# Patient Record
Sex: Male | Born: 1970 | Race: White | Hispanic: No | Marital: Single | Smoking: Former smoker
Health system: Southern US, Community
[De-identification: ages and names within clinical notes are randomized; demographics above are authoritative.]

## PROBLEM LIST (undated history)

## (undated) DIAGNOSIS — J302 Other seasonal allergic rhinitis: Secondary | ICD-10-CM

## (undated) DIAGNOSIS — I10 Essential (primary) hypertension: Secondary | ICD-10-CM

## (undated) DIAGNOSIS — K219 Gastro-esophageal reflux disease without esophagitis: Secondary | ICD-10-CM

## (undated) HISTORY — PX: ESOPHAGOGASTRODUODENOSCOPY: SHX1529

## (undated) HISTORY — PX: TUMOR REMOVAL: SHX12

## (undated) HISTORY — PX: WISDOM TOOTH EXTRACTION: SHX21

---

## 1998-06-28 ENCOUNTER — Ambulatory Visit (HOSPITAL_COMMUNITY): Admission: RE | Admit: 1998-06-28 | Discharge: 1998-06-28 | Payer: Self-pay | Admitting: Urology

## 2006-01-09 ENCOUNTER — Ambulatory Visit: Payer: Self-pay | Admitting: Internal Medicine

## 2006-01-09 ENCOUNTER — Emergency Department (HOSPITAL_COMMUNITY): Admission: EM | Admit: 2006-01-09 | Discharge: 2006-01-09 | Payer: Self-pay | Admitting: Emergency Medicine

## 2006-01-19 ENCOUNTER — Ambulatory Visit (HOSPITAL_COMMUNITY): Admission: RE | Admit: 2006-01-19 | Discharge: 2006-01-19 | Payer: Self-pay | Admitting: Internal Medicine

## 2006-01-19 ENCOUNTER — Encounter: Payer: Self-pay | Admitting: Internal Medicine

## 2006-03-11 ENCOUNTER — Ambulatory Visit: Payer: Self-pay | Admitting: Internal Medicine

## 2006-05-24 ENCOUNTER — Ambulatory Visit: Payer: Self-pay | Admitting: Internal Medicine

## 2006-12-03 ENCOUNTER — Ambulatory Visit: Payer: Self-pay | Admitting: Internal Medicine

## 2007-02-08 ENCOUNTER — Ambulatory Visit (HOSPITAL_COMMUNITY): Admission: RE | Admit: 2007-02-08 | Discharge: 2007-02-08 | Payer: Self-pay | Admitting: Family Medicine

## 2007-03-25 IMAGING — CR DG NECK SOFT TISSUE
2 series · 2 of 2 positions shown · non-contrast
Comparison: none

CLINICAL DATA: 34 year-old male pill lodged in throat, cannot swallow.
 SOFT TISSUE NECK- 2 VIEW:

[view not recorded (1 of 2)]
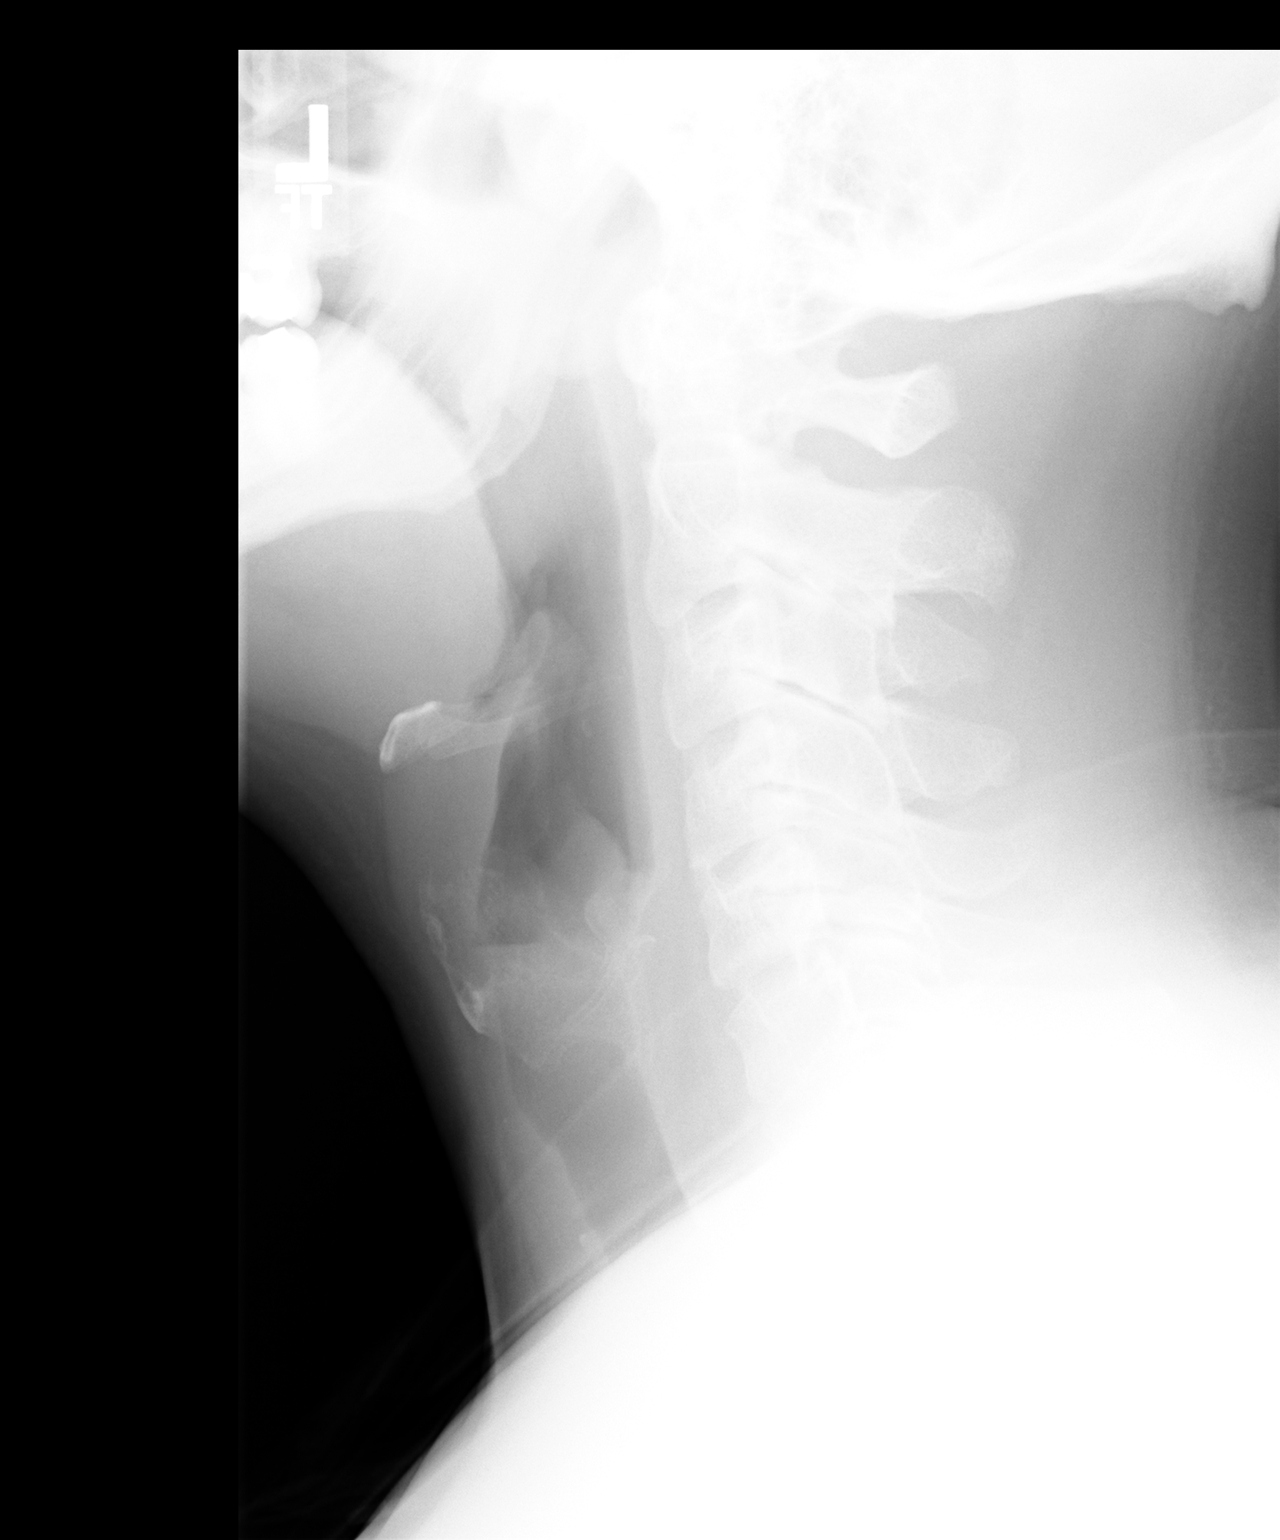

[view not recorded (2 of 2)]
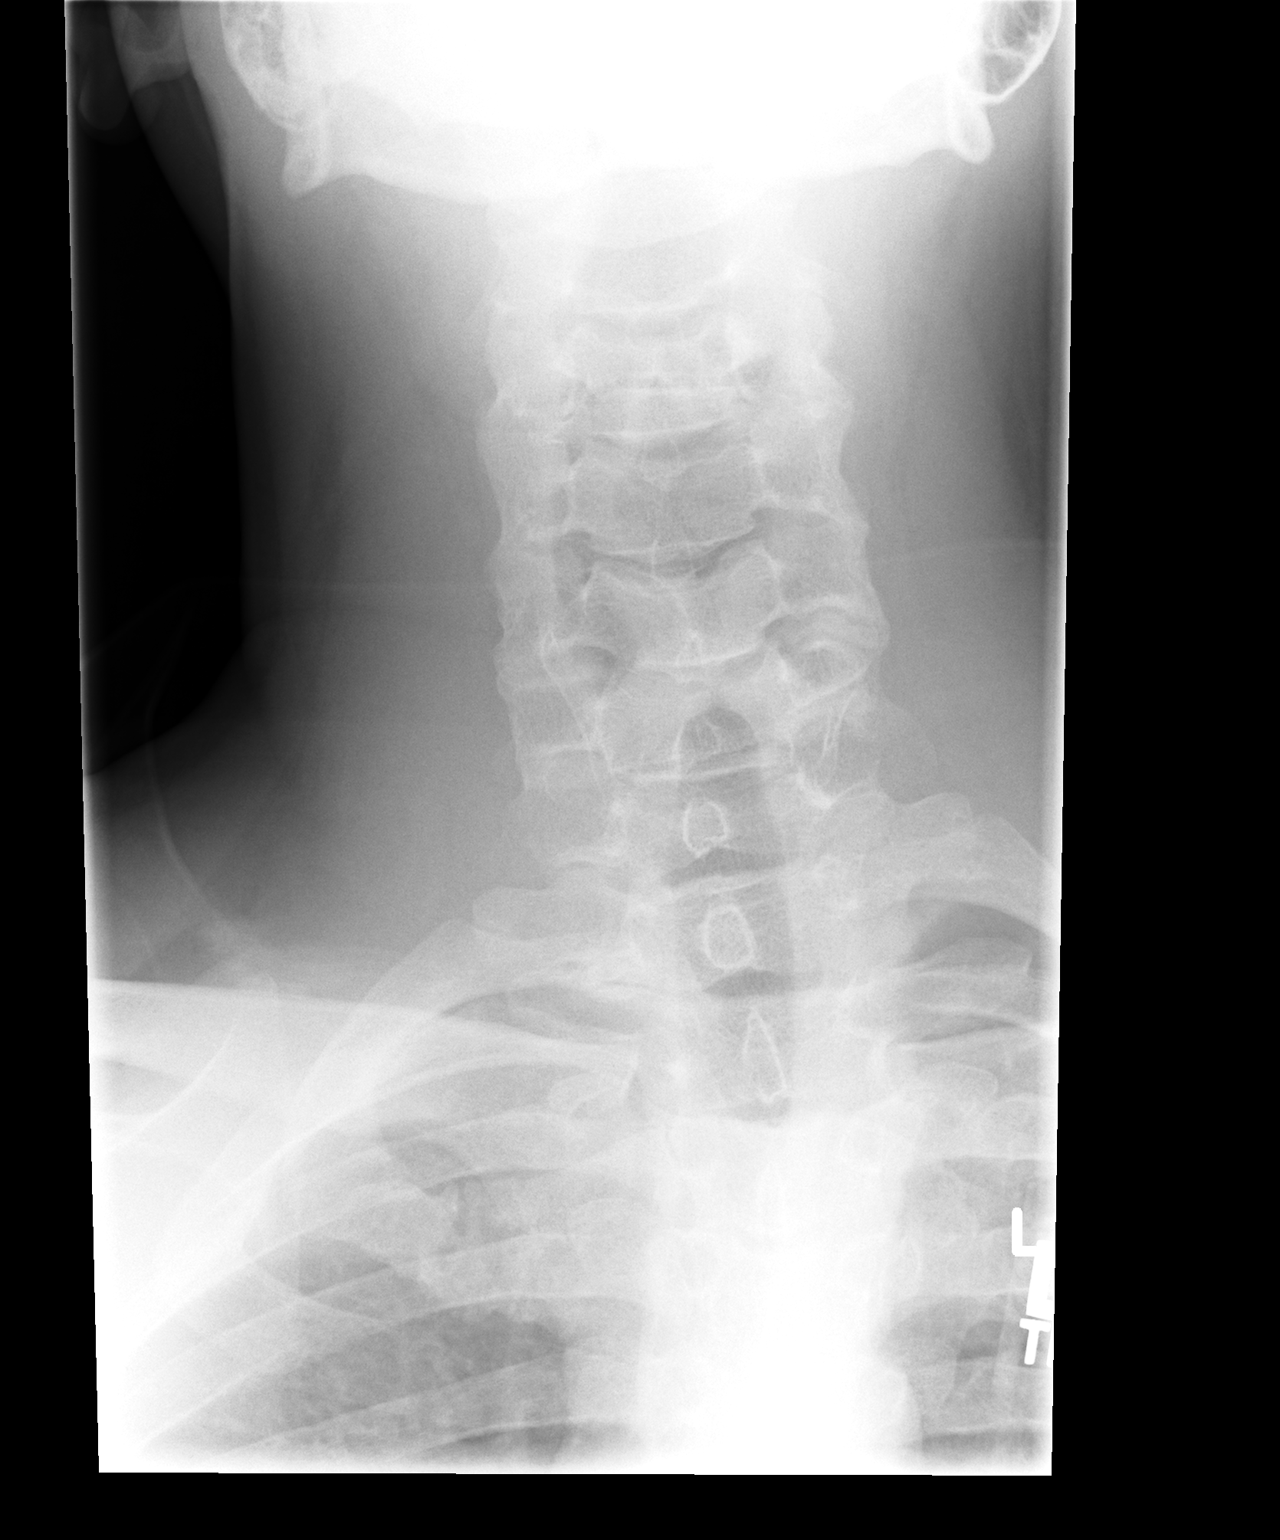

[2 of 2 positions shown; findings below may reference images not displayed]

FINDINGS: No radiopaque foreign body is identified within the upper esophagus or airway.  The epiglottis is within normal limits.  Soft tissues are unremarkable.
IMPRESSION: Negative soft tissue neck.

## 2007-11-22 ENCOUNTER — Ambulatory Visit: Payer: Self-pay | Admitting: Internal Medicine

## 2008-06-29 ENCOUNTER — Ambulatory Visit: Payer: Self-pay | Admitting: Internal Medicine

## 2008-06-29 DIAGNOSIS — S60459A Superficial foreign body of unspecified finger, initial encounter: Secondary | ICD-10-CM | POA: Insufficient documentation

## 2008-06-29 DIAGNOSIS — K200 Eosinophilic esophagitis: Secondary | ICD-10-CM | POA: Insufficient documentation

## 2008-12-26 ENCOUNTER — Ambulatory Visit: Payer: Self-pay | Admitting: Internal Medicine

## 2011-01-07 ENCOUNTER — Encounter (INDEPENDENT_AMBULATORY_CARE_PROVIDER_SITE_OTHER): Payer: Self-pay | Admitting: *Deleted

## 2011-01-20 NOTE — Assessment & Plan Note (Signed)
Summary: splinter in finger   Vital Signs:  Patient Profile:   40 Years Old Male Temp:     98.2 degrees F Pulse rate:   76 / minute BP sitting:   140 / 80  (left arm)  Vitals Entered By: Willy Eddy, LPN (June 29, 2008 7:48 AM)                 Chief Complaint:  splinter in finger since november.  History of Present Illness: presents for foreign object removal  ( injury occured over 6 weeks ago)    Prior Medication List:  No prior medications documented  Current Allergies (reviewed today): No known allergies   Past Medical History:    Reviewed history and no changes required:       eosinic esophagitis  Past Surgical History:    Reviewed history and no changes required:       EGD     Review of Systems  The patient denies anorexia, fever, weight loss, weight gain, vision loss, decreased hearing, hoarseness, chest pain, syncope, dyspnea on exertion, peripheral edema, prolonged cough, headaches, hemoptysis, abdominal pain, melena, hematochezia, severe indigestion/heartburn, hematuria, incontinence, genital sores, muscle weakness, suspicious skin lesions, transient blindness, difficulty walking, depression, unusual weight change, abnormal bleeding, enlarged lymph nodes, angioedema, breast masses, and testicular masses.     Physical Exam  General:     Well-developed,well-nourished,in no acute distress; alert,appropriate and cooperative throughout examination Head:     normocephalic and male-pattern balding.   Eyes:     No corneal or conjunctival inflammation noted. EOMI. Perrla. Funduscopic exam benign, without hemorrhages, exudates or papilledema. Vision grossly normal. Msk:     right index finger with calus formaton at base with escar at entry from removal efforts    Impression & Recommendations:  Problem # 1:  see EOSINOPHILIC ESOPHAGITIS (ICD-530.13) GI consult reviewed  Problem # 2:  FINGER SUP FB W/O MAJ OPEN WOUND&W/O MENTION INF  (ICD-915.6)  Orders: No Charge Patient Arrived (NCPA0) (NCPA0) Incision and Removal of Foreign Body, subq tissues; simple (10120)   Complete Medication List: 1)  Keflex 500 Mg Caps (Cephalexin) .... Three times a day   Patient Instructions: 1)  keflex called in   Prescriptions: KEFLEX 500 MG  CAPS (CEPHALEXIN) three times a day  #15 x 0   Entered by:   Willy Eddy, LPN   Authorized by:   Stacie Glaze MD   Signed by:   Willy Eddy, LPN on 36/64/4034   Method used:   Electronically sent to ...       [facility]  [address] [address] [facility]       [address]       [place], [place]  [address]       Ph: [phone]       Fax: [phone]   RxID:   [id number]  ]

## 2011-01-22 NOTE — Letter (Signed)
Summary: Recall Office Visit  [facility]  [address]   [place], [place] [address]   Phone: [phone]  Fax: [phone]      January 07, 2011   Brandon Monroe [address] [place], [place]  [address] [birth year]   Dear Mr. SCHRECENGOST,   According to our records, it is time for you to schedule a follow-up office visit with [place].   At your convenience, please call [phone] to schedule an office visit. If you have any questions, concerns, or feel that this letter is in error, we would appreciate your call.   Sincerely,    Diana Eves  [facility] Ph: [phone]   Fax: [phone]

## 2011-02-09 ENCOUNTER — Ambulatory Visit: Payer: Self-pay | Admitting: Internal Medicine

## 2011-02-13 ENCOUNTER — Ambulatory Visit: Payer: Self-pay | Admitting: Internal Medicine

## 2011-05-05 NOTE — Assessment & Plan Note (Signed)
NAMEMarland Kitchen  Brandon Monroe, Brandon Monroe              CHART#:  [id number]   DATE:  12/26/2008                       DOB:  [birth year]   Follow up eosinophilic esophagitis/gastroesophageal reflux disease.  Last seen 1 year ago on November 22, 2007.  The patient has done very  well.  He saw lost 11 pounds since he was last seen.  Typical reflux  symptoms quiescent on Prevacid 30 mg orally daily.  He is having no  dysphagia.  He saw Dr. Lucie Leather, he was found to have multiple allergies  including dogs, cats, peanuts, couple of other foods which he is now  trying to avoid.  He is doing extremely well.  He is followed by Dr.  Regino Schultze primarily.  His blood pressures are up.  He is being managed for  that problem.  He quit smoking a while back and was getting along well  without cigarettes, and alcohol assumption is minimal i.e. 10 beers a  year.   PHYSICAL EXAMINATION:  GENERAL:  A pleasant 40 year old gentleman  resting comfortably.  VITAL SIGNS:  Weight 260, height 6 feet, temperature 98.9, BP 138/96,  and pulse 80.  SKIN:  Warm and dry.  CHEST:  Lungs are clear to auscultation.  CARDIAC:  Regular rate and rhythm without murmur, gallop, or rub.  ABDOMEN:  Obese, positive bowel sounds, soft, nontender without  appreciable mass or organomegaly.  EXTREMITIES:  No edema.   ASSESSMENT:  Eosinophilic esophagitis, quiescent.  Gastroesophageal  reflux disease symptoms are well controlled on Prevacid.   RECOMMENDATIONS:  Continue weight loss would be a very good idea.  This  would help him to manage with his blood pressures well.  I suggested a  goal of losing additional 30 pounds over the next 12 months.  Unless  something comes up, I will touch base with the patient in 2 years.       Brandon Monroe, M.D.  Electronically Signed     RMR/MEDQ  D:  12/26/2008  T:  12/26/2008  Job:  [id number]   cc:   Kirk Ruths, M.D.

## 2011-05-05 NOTE — Assessment & Plan Note (Signed)
NAME:  Brandon Monroe, Brandon Monroe              CHART#:  [id number]   DATE:  11/22/2007                       DOB:  12-16-1971   CHIEF COMPLAINT:  Follow up of acid reflux and eosinophilic esophagitis.   SUBJECTIVE:  Patient is here for a follow-up visit.  He was last seen on  12/03/2006.  He has a history of eosinophilic esophagitis and chronic  GERD.  He has been doing very well.  He is on Prevacid 30 mg SoluTab  daily.  He has rare breakthrough reflux symptoms usually triggered by  certain foods.  He has had no recurrence of his dysphagia or  odynophagia.  Denies any abdominal pain, nausea or vomiting.  He quit  smoking.  He has gained approximately 40 pounds since then but only 4  pounds in the last 1 year.  Denies any problems with his bowel  movements.   CURRENT MEDICATIONS:  See updated list.   ALLERGIES:  No known drug allergies.   PHYSICAL EXAMINATION:  VITAL SIGNS:  Weight 276, height 6 feet 1/2 inch,  temp 99.1, blood pressure 162/96, pulse 80.  GENERAL:  Pleasant, well-nourished, well-developed Caucasian male in no  acute distress.  SKIN:  Warm and dry, no jaundice.  HEENT:  Sclerae are anicteric.  ABDOMEN:  Positive bowel sounds, soft, nontender, nondistended, no  organomegaly or masses.  LOWER EXTREMITIES:  No edema.   IMPRESSION:  1. Eosinophilic esophagitis, asymptomatic at this time.  2. Chronic gastroesophageal reflux disease, well controlled on      Prevacid.   RECOMMENDATIONS:  Patient requests switching to omeprazole due to  decreased cost.  We will go ahead and give him a trial of omeprazole 40  mg daily #30, 11 refills.  As long as his reflux  is well controlled, I see no problem with this.  He will give [place] a call  if he has any further problems.  Otherwise, we will see him back in 1  year.       Tana Coast, P.A.  Electronically Signed     R. Roetta Sessions, M.D.  Electronically Signed    LL/MEDQ  D:  11/22/2007  T:  11/22/2007  Job:  [id number]

## 2011-05-08 NOTE — Op Note (Signed)
NAME:  Brandon Monroe, Brandon Monroe NO.:  [id number]   MEDICAL RECORD NO.:  [id number]          PATIENT TYPE:  EMS   LOCATION:  ED                            FACILITY:  [facility]   PHYSICIAN:  R. Roetta Sessions, M.D. DATE OF BIRTH:  Jul 01, 1971   DATE OF PROCEDURE:  01/09/2006  DATE OF DISCHARGE:  01/09/2006                                 OPERATIVE REPORT   PROCEDURE:  Emergency esophagogastroduodenoscopy with esophageal pill  disimpaction.   ENDOSCOPIST:  Jonathon Bellows, M.D.   INDICATIONS FOR PROCEDURE:  The patient is a 40 year old gentleman with  chronic intermittent esophageal dysphagia and gastroesophageal reflux  disease who took an over-the-counter cold remedy followed by a vitamin at  approximately 5 p.m. today.  As soon as he swallowed the second pill, he  felt it hang pointing to his sternum.  Ever since, he has not be able to  swallow anything including saliva.  He presented to the emergency  department.  He was seen by Rhae Lerner. Margretta Ditty, M.D.  He was given some IV  Glucagon and observed and nothing pretty much changed over a couple of  hours.  The emergency department staff subsequently called me.  An EGD is  now being done because there is a high likelihood of pill impaction and/or  obstruction.  Potential risks, benefits, and alternatives have been reviewed  with Mr. Klabunde and his parents.  I told him the goal this evening is to  disimpact.  If he had a structural lesion such as a ring, stricture, or web,  that I would more than likely not dilate him in this setting and would bring  him back electively when his upper GI tract is empty.  All questions were  answered, and all parties were agreeable.  Please see the documentation in  the medical record.   PROCEDURE NOTE:  Oxygen saturation, blood pressure, and pulse were monitored  throughout the entire procedure.  Conscious sedation:  Versed 3 mg IV,  Demerol 75 mg IV.  Cetacaine spray for topical oropharyngeal  anesthesia.  Instrument:  Olympus video chip system.   FINDINGS:  Esophagus was easily intubated. There was a brown, granular,  foreign body in the tubular esophagus, mid-level.  There was a fluid level  above this.  The fluid was easily suctioned out.  The patient did some  heaving as I inspected this area.  Please see photos.  As he did heave, this  material started to break up where I could see the unobstructed lumen  distally.  I nudged the scope up against the remainder of this material, and  it then readily fell into the stomach.  The patient had quite a bit of bile-  stained gastric fluid.  I did not carry out a complete examination of the  stomach and duodenum.  It appears that he did have a hiatal hernia and  distal esophageal erosions and a ring as well.  I easily traversed the EG  junction with scope.  The esophageal mucosa was somewhat macerated, but I  did not see an ulcer.  He did appear to  have some distal esophageal  erosions.  Once the impaction was relieved, the exam was terminated. The  patient tolerated the procedure very well.   IMPRESSION:  1.  Status post pill impaction as described above.  2.  Status post disimpaction as described above.  3.  Distal esophageal erosions.  4.  Schatzki ring.  5.  Hiatal hernia.  6.  Stomach incompletely seen.  Exam not carried out to completion for the      reasons outlined above.   RECOMMENDATIONS:  1.  Soft diet until he can be brought back for an elective      esophagogastroduodenoscopy with most likely esophageal dilation.  2.  We will start him on some Prevacid Solutabs, 1 Solutab on the tongue      daily.   He is really not having any chest pain whatsoever, and I suspect he will do  well without requiring narcotics or further symptomatic treatment.   Our office will call him the first of the week and arrange endoscopy some  time the week after next.      Jonathon Bellows, M.D.  Electronically Signed      RMR/MEDQ  D:  01/09/2006  T:  01/10/2006  Job:  [id number]   cc:   Rhae Lerner. Margretta Ditty, M.D.  [address]  [place]  [place] [address]   [facility]

## 2011-05-08 NOTE — Op Note (Signed)
NAME:  SHAHZAIB, AZEVEDO             ACCOUNT NO.:  [id number]   MEDICAL RECORD NO.:  [id number]          PATIENT TYPE:  AMB   LOCATION:  DAY                           FACILITY:  [facility]   PHYSICIAN:  R. Roetta Sessions, M.D. DATE OF BIRTH:  October 01, 1971   DATE OF PROCEDURE:  01/19/2006  DATE OF DISCHARGE:                                 OPERATIVE REPORT   PROCEDURE:  Esophagogastroduodenoscopy with esophageal biopsy, esophageal  dilation with the scope.   INDICATIONS FOR PROCEDURE:  The patient is a 40 year old gentleman with  recurrent esophageal dysphagia. He presented back on January 09, 2006 with a  pill impaction. He underwent an emergent EGD and disimpaction. He was noted  to have a distal esophageal ring. He has been on a soft diet and Prevacid  every since. He returns now for elective EGD with esophageal dilation as  appropriate. This approach has been discussed with the patient at length.  Potential risks, benefits, and alternatives have been reviewed and questions  answered. He is agreeable. Please see documentation in the medical record.   PROCEDURE NOTE:  O2 saturation, blood pressure, pulse, and respirations were  monitored throughout the entire procedure. Conscious sedation with Versed 5  mg IV and Demerol 100 mg IV in divided doses.  Cetacaine spray for topical  oropharyngeal anesthesia.   FINDINGS:  Examination of the tubular esophagus revealed a prominent ringed  or feline esophagus.   The GIF-140 scope actually hung up in the proximal esophagus. Only after  mild to moderate pressure was applied was the scope was released by the  rings (this was associated with a traumatic dilation of the proximal  esophagus). I was then able to freely advance the scope down into the  stomach.   Stomach:  Gastric cavity was empty and insufflated well with air. Thorough  examination of gastric mucosa including retroflexed view of the proximal  stomach and esophagogastric junction was  undertaken. The stomach was empty.  There was a small hiatal hernia. Pylorus patent and easily traversed.  Examination of bulb and second portion revealed no abnormalities. We pulled  the scope back into the esophagus. There was a 4-cm rent in the proximal  esophageal mucosa consistent with trauma of esophageal dilation with the  diagnostic scope. There was prominent ring appearance of the esophagus. It  had the appearance of a trachea or cat esophagus. This goes with  eosinophilic esophagitis. I did not feel it would be safe to attempt  dilation in any further manner. Subsequently, the mid esophagus was biopsied  multiple times to further evaluate this gentleman for eosinophilic  esophagitis. The patient tolerated the procedure well and was reactive to  endoscopy.   IMPRESSION:  1.  Traumatic dilation of the proximal esophagus with the scope. Prominent      feline or ringed appearing esophagus in this clinical scenario is      eosinophilic esophagitis until proven otherwise. Biopsies of the      esophageal mucosa taken. The remainder of the esophageal mucosa appeared      normal.  2.  Small hiatal hernia. Otherwise normal stomach.  Patent pylorus. Normal D1      and D2.   RECOMMENDATIONS:  1.  Continue Prevacid 30 mg orally daily via SoluTab.  2.  Soft diet.  3.  Follow up on pathology. If pathology is confirmatory, the patient will      need to go on inhalant steroids orally.  4.  Further recommendations to follow.      Jonathon Bellows, M.D.  Electronically Signed     RMR/MEDQ  D:  01/19/2006  T:  01/19/2006  Job:  [id number]   cc:   Rhae Lerner. Margretta Ditty, M.D.  [address]  [place]  [place] [address]   [facility]

## 2011-06-15 ENCOUNTER — Ambulatory Visit: Payer: Self-pay | Admitting: Internal Medicine

## 2011-07-06 ENCOUNTER — Ambulatory Visit: Payer: Self-pay | Admitting: Internal Medicine

## 2011-07-31 ENCOUNTER — Ambulatory Visit: Payer: Self-pay | Admitting: Internal Medicine

## 2011-09-25 ENCOUNTER — Encounter: Payer: Self-pay | Admitting: Internal Medicine

## 2011-09-28 ENCOUNTER — Ambulatory Visit: Payer: Self-pay | Admitting: Internal Medicine

## 2011-10-16 ENCOUNTER — Ambulatory Visit: Payer: Self-pay | Admitting: Internal Medicine

## 2011-11-09 ENCOUNTER — Ambulatory Visit: Payer: Self-pay | Admitting: Internal Medicine

## 2011-12-28 ENCOUNTER — Ambulatory Visit: Payer: Self-pay | Admitting: Internal Medicine

## 2012-01-08 ENCOUNTER — Encounter: Payer: Self-pay | Admitting: Internal Medicine

## 2012-02-01 ENCOUNTER — Ambulatory Visit: Payer: Self-pay | Admitting: Internal Medicine

## 2012-02-19 ENCOUNTER — Ambulatory Visit: Payer: Self-pay | Admitting: Internal Medicine

## 2012-02-23 ENCOUNTER — Ambulatory Visit: Payer: Self-pay | Admitting: Internal Medicine

## 2012-05-04 ENCOUNTER — Ambulatory Visit: Payer: Self-pay | Admitting: Internal Medicine

## 2012-08-08 ENCOUNTER — Encounter: Payer: Self-pay | Admitting: Internal Medicine

## 2013-02-06 ENCOUNTER — Ambulatory Visit: Payer: Self-pay | Admitting: Internal Medicine

## 2016-04-26 ENCOUNTER — Encounter (HOSPITAL_COMMUNITY): Payer: Self-pay | Admitting: Emergency Medicine

## 2016-04-26 ENCOUNTER — Emergency Department (HOSPITAL_COMMUNITY)
Admission: EM | Admit: 2016-04-26 | Discharge: 2016-04-26 | Disposition: A | Discharge status: Home / Self Care | Payer: BLUE CROSS/BLUE SHIELD | Attending: Emergency Medicine | Admitting: Emergency Medicine

## 2016-04-26 DIAGNOSIS — I10 Essential (primary) hypertension: Secondary | ICD-10-CM | POA: Insufficient documentation

## 2016-04-26 DIAGNOSIS — Z7982 Long term (current) use of aspirin: Secondary | ICD-10-CM | POA: Diagnosis not present

## 2016-04-26 DIAGNOSIS — K122 Cellulitis and abscess of mouth: Secondary | ICD-10-CM | POA: Insufficient documentation

## 2016-04-26 DIAGNOSIS — Z79899 Other long term (current) drug therapy: Secondary | ICD-10-CM | POA: Diagnosis not present

## 2016-04-26 DIAGNOSIS — R0602 Shortness of breath: Secondary | ICD-10-CM | POA: Diagnosis present

## 2016-04-26 DIAGNOSIS — Z87891 Personal history of nicotine dependence: Secondary | ICD-10-CM | POA: Diagnosis not present

## 2016-04-26 HISTORY — DX: Essential (primary) hypertension: I10

## 2016-04-26 LAB — RAPID STREP SCREEN (MED CTR MEBANE ONLY): Streptococcus, Group A Screen (Direct): NEGATIVE

## 2016-04-26 MED ORDER — DEXAMETHASONE SODIUM PHOSPHATE 4 MG/ML IJ SOLN
10.0000 mg | Freq: Once | INTRAMUSCULAR | Status: AC
  Administered 2016-04-26: 10 mg via INTRAMUSCULAR
  Filled 2016-04-26: qty 3

## 2016-04-26 NOTE — Discharge Instructions (Signed)
Uvulitis °Uvulitis is infection or inflammation of the uvula. The uvula is the small, finger-like piece of tissue that hangs down at the back of your throat. °CAUSES °This condition may be caused by: °· An infection in the mouth or throat. This is the most common cause. °· Trauma to the uvula. Causes of trauma include burning your mouth and heavy snoring. °· Fluid build-up (edema). Edema can be triggered be an allergic reaction. Uvulitis that is caused by edema is called Quincke disease. °· Inhaling irritants, such as chemical agents, smoke, or steam. °SYMPTOMS °Symptoms of this condition depend on the cause.  °Symptoms of uvulitis that is caused by infection include: °· Red, swollen uvula. °· Sore throat. °· Fever. °· Headache. °· Swollen neck glands. °Symptoms of uvulitis that is caused by trauma, edema, or irritation include: °· Red, swollen uvula. °· Sore throat. °· Trouble swallowing. °· Choking or gagging. °· Trouble breathing. °DIAGNOSIS °This condition is diagnosed with a physical exam. You also may have tests, such as a throat culture and blood tests. °TREATMENT °Treatment for this condition depends on the cause. Treatment may involve: °· Antibiotic medicine. Antibiotics may be prescribed if a bacterial infection is the cause. °· Steroid medicine. Steroids may be given if edema is the cause. °· Surgery to remove part of the uvula (partial uvulectomy). °HOME CARE INSTRUCTIONS °· Rest as much as possible until your condition improves. °· Drink enough fluid to keep your urine clear or pale yellow. °· Take over-the-counter and prescription medicines only as told by your health care provider. °· If you were prescribed an antibiotic medicine, take it as told by your health care provider. Do not stop taking the antibiotic even if you start to feel better. °· Use a cool-mist humidifier to ease irritation in your throat. °· While your throat is sore: °¨ Eat soft foods or drink liquids, such as soup. °¨ Gargle with a  salt-water mixture 3-4 times per day or as needed. To make a salt-water mixture, completely dissolve ½-1 tsp of salt in 1 cup of warm water. °· Keep all follow-up visits as told by your health care provider. This is important. °SEEK MEDICAL CARE IF: °· You have a fever. °· You have trouble eating. °· Your symptoms do not get better. °· Your symptoms come back after treatment. °SEEK IMMEDIATE MEDICAL CARE IF: °· You have trouble breathing. °· You have trouble swallowing. °  °This information is not intended to replace advice given to you by your health care provider. Make sure you discuss any questions you have with your health care provider. °  °Document Released: 07/17/2004 Document Revised: 08/28/2015 Document Reviewed: 02/27/2015 °Elsevier Interactive Patient Education ©2016 Elsevier Inc. ° °

## 2016-04-26 NOTE — ED Notes (Addendum)
Pt reports waking up feeling like his throat was closing. Pt states he has seasonal allergies. Pt reports sore throat. Airway patent. Denies new medications or food. Pt's voice sounds raspy upon assessment. Pt reports taking two benadryl PTA.

## 2016-04-26 NOTE — ED Notes (Signed)
Pt resting comfortably

## 2016-04-26 NOTE — ED Provider Notes (Signed)
CSN: [id number]     Arrival date & time 04/26/16  0865 History  By signing my name below, I, Soijett Blue, attest that this documentation has been prepared under the direction and in the presence of Nelva Nay, MD. Electronically Signed: Soijett Blue, ED Scribe. 04/26/2016. 9:09 AM.   Chief Complaint  Patient presents with  . Shortness of Breath      The history is provided by the patient. No language interpreter was used.    Brandon Monroe is a 45 y.o. male with a medical hx of HTN who presents to the Emergency Department complaining of SOB onset this morning. Pt reports that he woke up this morning with throat closing sensation. Pt states that he experienced these symptoms mildly last night which he contributed to allergies. Pt takes claritin and Rx nasal spray daily and 2 benadryl nightly for his seasonal allergies. Pt states that he ate [employer] last night. Pt is having associated symptoms of post-nasal drip. He notes that he has tried 2 benadryl with no relief of his symptoms. He denies any other symptoms. Pt notes that he does take medications for his HTN.   Past Medical History  Diagnosis Date  . Esophagitis     eosinic   . Hypertension    Past Surgical History  Procedure Laterality Date  . Esophagogastroduodenoscopy    . Tumor removal      RT testicle   No family history on file. Social History  Substance Use Topics  . Smoking status: Former Games developer  . Smokeless tobacco: None  . Alcohol Use: Yes     Comment: occ    Review of Systems  Constitutional: Negative for fever and chills.  HENT: Positive for postnasal drip.   Respiratory: Positive for shortness of breath.   All other systems reviewed and are negative.   Allergies  Peanut-containing drug products  Home Medications   Prior to Admission medications   Medication Sig Start Date End Date Taking? Authorizing Provider  aspirin EC 81 MG tablet Take 81 mg by mouth daily.   Yes Historical Provider, MD   diphenhydrAMINE (BENADRYL) 25 MG tablet Take 50 mg by mouth at bedtime.   Yes Historical Provider, MD  fluticasone (FLONASE) 50 MCG/ACT nasal spray Place 2 sprays into both nostrils daily. 03/05/16  Yes Historical Provider, MD  hydrochlorothiazide (HYDRODIURIL) 25 MG tablet Take 1 tablet by mouth daily. 03/05/16  Yes Historical Provider, MD  ibuprofen (ADVIL,MOTRIN) 200 MG tablet Take 400 mg by mouth every 6 (six) hours as needed for moderate pain.   Yes Historical Provider, MD  loratadine (CLARITIN) 10 MG tablet Take 10 mg by mouth daily.   Yes Historical Provider, MD  losartan (COZAAR) 100 MG tablet Take 1 tablet by mouth daily. 03/05/16  Yes Historical Provider, MD  Multiple Vitamin (MULTIVITAMIN WITH MINERALS) TABS tablet Take 1 tablet by mouth daily.   Yes Historical Provider, MD   BP 161/89 mmHg  Pulse 106  Temp(Src) 97.7 F (36.5 C) (Oral)  Resp 22  Ht  (1.854 m)  Wt 290 lb (131.543 kg)  BMI 38.27 kg/m2  SpO2 99% Physical Exam Physical Exam  Nursing note and vitals reviewed. Constitutional: He is oriented to person, place, and time. He appears well-developed and well-nourished. No distress.  Patient shouldn't able to speak in full sentences.   HENT: Mouth: Uvula is swollen and edematous.  No airway obstruction.  Head: Normocephalic and atraumatic.  Eyes: Pupils are equal, round, and reactive to light.  Neck: Normal range of motion.  Cardiovascular: Normal rate and intact distal pulses.   Pulmonary/Chest: No respiratory distress.  no wheezing no use of accessory muscles. Abdominal: Normal appearance. He exhibits no distension.  Musculoskeletal: Normal range of motion.  Neurological: He is alert and oriented to person, place, and time. No cranial nerve deficit.  Skin: Skin is warm and dry. No rash noted.  Psychiatric: He has a normal mood and affect. His behavior is normal.   ED Course  Procedures (including critical care time) Medications  dexamethasone (DECADRON)  injection 10 mg (10 mg Intramuscular Given 04/26/16 0918)    DIAGNOSTIC STUDIES: Oxygen Saturation is 99% on RA, nl by my interpretation.    COORDINATION OF CARE: 9:07 AM Discussed treatment plan with pt at bedside which includes rapid strep screen and decadron injection and pt agreed to plan.    Labs Review Labs Reviewed  RAPID STREP SCREEN (NOT AT [facility])  CULTURE, GROUP A STREP [facility])     MDM   Final diagnoses:  Uvulitis   I personally performed the services described in this documentation, which was scribed in my presence. The recorded information has been reviewed and considered.    Nelva Nayobert Emmersen Garraway, MD 04/26/16 [phone]

## 2016-04-29 LAB — CULTURE, GROUP A STREP (THRC)

## 2016-10-20 ENCOUNTER — Encounter (HOSPITAL_COMMUNITY): Payer: Self-pay | Admitting: *Deleted

## 2016-10-20 ENCOUNTER — Emergency Department (HOSPITAL_COMMUNITY)
Admission: EM | Admit: 2016-10-20 | Discharge: 2016-10-21 | Disposition: A | Discharge status: Home / Self Care | Payer: BLUE CROSS/BLUE SHIELD | Attending: Emergency Medicine | Admitting: Emergency Medicine

## 2016-10-20 DIAGNOSIS — Z79899 Other long term (current) drug therapy: Secondary | ICD-10-CM | POA: Diagnosis not present

## 2016-10-20 DIAGNOSIS — R0789 Other chest pain: Secondary | ICD-10-CM

## 2016-10-20 DIAGNOSIS — R0781 Pleurodynia: Secondary | ICD-10-CM | POA: Diagnosis present

## 2016-10-20 DIAGNOSIS — Z791 Long term (current) use of non-steroidal anti-inflammatories (NSAID): Secondary | ICD-10-CM | POA: Insufficient documentation

## 2016-10-20 DIAGNOSIS — Z87891 Personal history of nicotine dependence: Secondary | ICD-10-CM | POA: Diagnosis not present

## 2016-10-20 DIAGNOSIS — Z7982 Long term (current) use of aspirin: Secondary | ICD-10-CM | POA: Insufficient documentation

## 2016-10-20 DIAGNOSIS — K219 Gastro-esophageal reflux disease without esophagitis: Secondary | ICD-10-CM

## 2016-10-20 DIAGNOSIS — R51 Headache: Secondary | ICD-10-CM | POA: Insufficient documentation

## 2016-10-20 DIAGNOSIS — I10 Essential (primary) hypertension: Secondary | ICD-10-CM | POA: Insufficient documentation

## 2016-10-20 HISTORY — DX: Other seasonal allergic rhinitis: J30.2

## 2016-10-20 HISTORY — DX: Gastro-esophageal reflux disease without esophagitis: K21.9

## 2016-10-20 NOTE — ED Triage Notes (Signed)
Pt c/o mid sternal chest pain that started tonight; pt states it feels like a burning sensation; pt denies any radiation of pain; pt states he has a hx of acid reflux and pt states his head is throbbing; pt states his BP has been running hight at home

## 2016-10-21 ENCOUNTER — Emergency Department (HOSPITAL_COMMUNITY): Payer: BLUE CROSS/BLUE SHIELD

## 2016-10-21 LAB — COMPREHENSIVE METABOLIC PANEL
ALT: 39 U/L (ref 17–63)
AST: 33 U/L (ref 15–41)
Albumin: 4.1 g/dL (ref 3.5–5.0)
Alkaline Phosphatase: 83 U/L (ref 38–126)
Anion gap: 7 (ref 5–15)
BUN: 13 mg/dL (ref 6–20)
CO2: 32 mmol/L (ref 22–32)
Calcium: 9.1 mg/dL (ref 8.9–10.3)
Chloride: 98 mmol/L — ABNORMAL LOW (ref 101–111)
Creatinine, Ser: 0.99 mg/dL (ref 0.61–1.24)
GFR calc Af Amer: >60 mL/min (ref >60)
GFR calc non Af Amer: >60 mL/min (ref >60)
Glucose, Bld: 131 mg/dL — ABNORMAL HIGH (ref 65–99)
Potassium: 3.7 mmol/L (ref 3.5–5.1)
Sodium: 137 mmol/L (ref 135–145)
Total Bilirubin: 0.6 mg/dL (ref 0.3–1.2)
Total Protein: 7.6 g/dL (ref 6.5–8.1)

## 2016-10-21 LAB — CBC WITH DIFFERENTIAL/PLATELET
Basophils Absolute: 0.1 10*3/uL (ref 0.0–0.1)
Basophils Relative: 1 %
Eosinophils Absolute: 0.5 10*3/uL (ref 0.0–0.7)
Eosinophils Relative: 6 %
HCT: 41.4 % (ref 39.0–52.0)
Hemoglobin: 14.5 g/dL (ref 13.0–17.0)
Lymphocytes Relative: 42 %
Lymphs Abs: 3.2 10*3/uL (ref 0.7–4.0)
MCH: 32 pg (ref 26.0–34.0)
MCHC: 35 g/dL (ref 30.0–36.0)
MCV: 91.4 fL (ref 78.0–100.0)
Monocytes Absolute: 0.9 10*3/uL (ref 0.1–1.0)
Monocytes Relative: 11 %
Neutro Abs: 3 10*3/uL (ref 1.7–7.7)
Neutrophils Relative %: 40 %
Platelets: 189 10*3/uL (ref 150–400)
RBC: 4.53 MIL/uL (ref 4.22–5.81)
RDW: 13 % (ref 11.5–15.5)
WBC: 7.5 10*3/uL (ref 4.0–10.5)

## 2016-10-21 LAB — TROPONIN I: Troponin I: <0.03 ng/mL (ref <0.03)

## 2016-10-21 MED ORDER — FAMOTIDINE 20 MG PO TABS
20.0000 mg | ORAL_TABLET | Freq: Once | ORAL | Status: AC
  Administered 2016-10-21: 20 mg via ORAL
  Filled 2016-10-21: qty 1

## 2016-10-21 MED ORDER — GI COCKTAIL ~~LOC~~
30.0000 mL | Freq: Once | ORAL | Status: AC
  Administered 2016-10-21: 30 mL via ORAL
  Filled 2016-10-21: qty 30

## 2016-10-21 NOTE — ED Provider Notes (Signed)
[facility]EM[facility]Y DEPT Provider Note   CSN: [id number] Arrival date & time: 10/20/16  2337  By signing my name below, I, Vista Mink, attest that this documentation has been prepared under the direction and in the presence of No att. providers found. Electronically signed, Vista Mink, ED Scribe. 10/21/16. 12:28 AM.  Time seen 12:04 AM  History   Chief Complaint Chief Complaint  Patient presents with  . Chest Pain    HPI HPI Comments: Brandon Monroe is a 45 y.o. male with Hx of GERD, who presents to the Emergency Department complaining of intermittent central chest pain that started on October 29. Pt states that it feels like a burning sensation in his central chest, currently a 2/10 and was a 6/10 earlier today and the first day it started. Pt states that he checked his BP tonight and was 165/86, normally runs 122/76. He states that he became anxious and started to have a headache described as "it felt like my heart was pounding in my head" lasted one hour and subsided on arrival to the ED. His chest pain is exacerbated by laying down. His chest pain this evening started around 1830 after eating dinner and ate a [place] sandwich and grapes. He is unsure whether these foods exacerbated the pain. Pt is currently being treated for HTN by his PCP which is why he checks his blood pressure 3x week. He is prescribed hydrochlorothiazide and cozaar which he takes as directed. . No headache currently. No shortness of breath, did have nausea after checking his BP but not now, no vomiting, no diaphoresis. He states laying down and acid containing foods makes his heartburn worse, take prevacid or zantac usually helps but didn't help tonight. Has had burning fluid in throat before but not now.   He reports his father has 12 siblings and 2 of his paternal uncles have had high past surgery, and 2 other paternal uncles have had cardiac stents. He states his mother's family is significant for  hypertension.  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   The history is provided by the patient. No language interpreter was used.   PCP [facility]  Past Medical History:  Diagnosis Date  . Acid reflux   . Esophagitis    eosinic   . Hypertension   . Seasonal allergies     Patient Active Problem List   Diagnosis Date Noted  . 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 FINGER SUP FB W/O MAJ OPEN WOUND&W/O MENTION INF 06/29/2008    Past Surgical History:  Procedure Laterality Date  . ESOPHAGOGASTRODUODENOSCOPY    . TUMOR REMOVAL     RT testicle  . WISDOM TOOTH EXTRACTION         Home Medications    Prior to Admission medications   Medication Sig Start Date End Date Taking? Authorizing Provider  Omega-3 Fatty Acids (FISH OIL PO) Take by mouth.   Yes Historical Provider, MD  aspirin EC 81 MG tablet Take 81 mg by mouth daily.    Historical Provider, MD  diphenhydrAMINE (BENADRYL) 25 MG tablet Take 50 mg by mouth at bedtime.    Historical Provider, MD  fluticasone (FLONASE) 50 MCG/ACT nasal spray Place 2 sprays into both nostrils daily. 03/05/16   Historical Provider, MD  hydrochlorothiazide (HYDRODIURIL) 25 MG tablet Take 1 tablet by mouth daily. 03/05/16   Historical Provider, MD  ibuprofen (ADVIL,MOTRIN) 200 MG tablet Take 400 mg by mouth every 6 (six)  hours as needed for moderate pain.    Historical Provider, MD  loratadine (CLARITIN) 10 MG tablet Take 10 mg by mouth daily.    Historical Provider, MD  losartan (COZAAR) 100 MG tablet Take 1 tablet by mouth daily. 03/05/16   Historical Provider, MD  Multiple Vitamin (MULTIVITAMIN WITH MINERALS) TABS tablet Take 1 tablet by mouth daily.    Historical Provider, MD    Family History History reviewed. No pertinent family history.  Social History Social History  Substance Use Topics  . Smoking status: Former Games developer  . Smokeless tobacco: Never Used  . Alcohol use Yes      Comment: occ  Owns his own business Last drank on Oct 28, 2 beers  Allergies   Peanut-containing drug products   Review of Systems Review of Systems  Respiratory: Negative for shortness of breath.   Cardiovascular: Positive for chest pain.  Neurological: Positive for headaches.  All other systems reviewed and are negative.    Physical Exam Updated Vital Signs BP 165/89 (BP Location: Left Arm)   Pulse 101   Temp 98.4 F (36.9 C) (Oral)   Resp 18   Ht 6' 0.5" (1.842 m)   Wt (!) 310 lb (140.6 kg)   SpO2 99%   BMI 41.47 kg/m   Vital signs normal except for hypertension and tachycardia   Physical Exam  Constitutional: He is oriented to person, place, and time. He appears well-developed and well-nourished.  Non-toxic appearance. He does not appear ill. No distress.  HENT:  Head: Normocephalic and atraumatic.  Right Ear: External ear normal.  Left Ear: External ear normal.  Nose: Nose normal. No mucosal edema or rhinorrhea.  Mouth/Throat: Oropharynx is clear and moist and mucous membranes are normal. No dental abscesses or uvula swelling.  Eyes: Conjunctivae and EOM are normal. Pupils are equal, round, and reactive to light.  Neck: Normal range of motion and full passive range of motion without pain. Neck supple.  Cardiovascular: Normal rate, regular rhythm and normal heart sounds.  Exam reveals no gallop and no friction rub.   No murmur heard. Pulmonary/Chest: Effort normal and breath sounds normal. No respiratory distress. He has no wheezes. He has no rhonchi. He has no rales. He exhibits no tenderness and no crepitus.  Abdominal: Soft. Normal appearance and bowel sounds are normal. He exhibits no distension. There is no tenderness. There is no rebound and no guarding.  Musculoskeletal: Normal range of motion. He exhibits no edema or tenderness.  Moves all extremities well.   Neurological: He is alert and oriented to person, place, and time. He has normal strength. No  cranial nerve deficit.  Skin: Skin is warm, dry and intact. No rash noted. No erythema. No pallor.  Psychiatric: His speech is normal and behavior is normal. His mood appears anxious.  Nursing note and vitals reviewed.    ED Treatments / Results  Labs (all labs ordered are listed, but only abnormal results are displayed) Results for orders placed or performed during the hospital encounter of 10/20/16  CBC with Differential  Result Value Ref Range   WBC 7.5 4.0 - 10.5 K/uL   RBC 4.53 4.22 - 5.81 MIL/uL   Hemoglobin 14.5 13.0 - 17.0 g/dL   HCT 16.1 09.6 - 04.5 %   MCV 91.4 78.0 - 100.0 fL   MCH 32.0 26.0 - 34.0 pg   MCHC 35.0 30.0 - 36.0 g/dL   RDW 40.9 81.1 - 91.4 %   Platelets 189 150 - 400  K/uL   Neutrophils Relative % 40 %   Neutro Abs 3.0 1.7 - 7.7 K/uL   Lymphocytes Relative 42 %   Lymphs Abs 3.2 0.7 - 4.0 K/uL   Monocytes Relative 11 %   Monocytes Absolute 0.9 0.1 - 1.0 K/uL   Eosinophils Relative 6 %   Eosinophils Absolute 0.5 0.0 - 0.7 K/uL   Basophils Relative 1 %   Basophils Absolute 0.1 0.0 - 0.1 K/uL  Comprehensive metabolic panel  Result Value Ref Range   Sodium 137 135 - 145 mmol/L   Potassium 3.7 3.5 - 5.1 mmol/L   Chloride 98 (L) 101 - 111 mmol/L   CO2 32 22 - 32 mmol/L   Glucose, Bld 131 (H) 65 - 99 mg/dL   BUN 13 6 - 20 mg/dL   Creatinine, Ser 1.610.99 0.61 - 1.24 mg/dL   Calcium 9.1 8.9 - 09.610.3 mg/dL   Total Protein 7.6 6.5 - 8.1 g/dL   Albumin 4.1 3.5 - 5.0 g/dL   AST 33 15 - 41 U/L   ALT 39 17 - 63 U/L   Alkaline Phosphatase 83 38 - 126 U/L   Total Bilirubin 0.6 0.3 - 1.2 mg/dL   GFR calc non Af Amer >60 >60 mL/min   GFR calc Af Amer >60 >60 mL/min   Anion gap 7 5 - 15  Troponin I  Result Value Ref Range   Troponin I <0.03 <0.03 ng/mL   Laboratory interpretation all normal     EKG  EKG Interpretation  Date/Time:  Tuesday October 20 2016 23:51:57 EDT Ventricular Rate:  101 PR Interval:    QRS Duration: 118 QT Interval:  354 QTC  Calculation: 459 R Axis:   42 Text Interpretation:  Sinus tachycardia Nonspecific intraventricular conduction delay No old tracing to compare Confirmed by Dalary Hollar  MD-I, Chasyn Cinque ([id number]) on 10/21/2016 12:03:02 AM       Radiology Dg Chest Port 1 View  Result Date: 10/21/2016 CLINICAL DATA:  Acute onset of midsternal chest pain. Initial encounter. EXAM: PORTABLE CHEST 1 VIEW COMPARISON:  Chest radiograph performed 02/08/2007 FINDINGS: The lungs are well-aerated. Mild vascular congestion is noted. There is no evidence of focal opacification, pleural effusion or pneumothorax. The cardiomediastinal silhouette is within normal limits. No acute osseous abnormalities are seen. IMPRESSION: Mild vascular congestion noted.  Lungs remain grossly clear. Electronically Signed   By: Roanna RaiderJeffery  Chang M.D.   On: 10/21/2016 00:35    Procedures Procedures (including critical care time)  Medications Ordered in ED Medications  gi cocktail (Maalox,Lidocaine,Donnatal) (30 mLs Oral Given 10/21/16 0027)  famotidine (PEPCID) tablet 20 mg (20 mg Oral Given 10/21/16 0027)     Initial Impression / Assessment and Plan / ED Course  I have reviewed the triage vital signs and the nursing notes.  Pertinent labs & imaging results that were available during my care of the patient were reviewed by me and considered in my medical decision making (see chart for details).  Clinical Course  DIAGNOSTIC STUDIES: Oxygen Saturation is 99% on RA, normal by my interpretation.  COORDINATION OF CARE: 12:15 AM-Will order labs. Discussed treatment plan with pt at bedside and pt agreed to plan.  Patient was given GI cocktail and Pepcid.  Recheck at 1:40 AM patient is feeling better, he states he's been belching a lot. We discussed his test results. His troponin is a 6 hour after his chest pain started so only one troponin was done. We discussed increasing his Prevacid to twice a day for  1-2 weeks and then back down to once a day. We also  discussed cardiology referral just because of his family history and he states he will talk to his family doctor about that.   Final Clinical Impressions(s) / ED Diagnoses   Final diagnoses:  Atypical chest pain  Gastroesophageal reflux disease without esophagitis  Essential hypertension    New Prescriptions Increased prevacid to BID x 2 weeks  Plan discharge  Devoria AlbeIva Braden Cimo, MD, FACEP   I personally performed the services described in this documentation, which was scribed in my presence. The recorded information has been reviewed and considered.  Devoria AlbeIva Jessyca Sloan, MD, Concha PyoFACEP     Lorane Cousar, MD 10/21/16 0200

## 2016-10-21 NOTE — Discharge Instructions (Signed)
Increase your prevacid to twice a day for the next 2 weeks then back down to once a day. You can take TUMS for discomfort between doses of prevacid. Have your doctor recheck you soon, keep the blood pressure log to show her so she can decide if she needs to change your blood pressure medications.  Also consider seeing a cardiologist to get further tests done because of your family history of heart disease. Return to the ED if you feel worse.

## 2017-06-17 ENCOUNTER — Encounter: Payer: Self-pay | Admitting: General Surgery

## 2017-06-17 ENCOUNTER — Ambulatory Visit (INDEPENDENT_AMBULATORY_CARE_PROVIDER_SITE_OTHER): Payer: BLUE CROSS/BLUE SHIELD | Admitting: General Surgery

## 2017-06-17 VITALS — BP 162/95 | HR 93 | Temp 97.8°F | Resp 18 | Ht 73.0 in | Wt 312.0 lb

## 2017-06-17 DIAGNOSIS — L0591 Pilonidal cyst without abscess: Secondary | ICD-10-CM

## 2017-06-17 NOTE — Progress Notes (Signed)
Brandon Monroe; [id number]; 02-Sep-1971   HPI Patient is a 46 year old white male who is referred to my care by [facility] for evaluation treatment of pilonidal cyst. He had extensive pilonidal cyst excision approximately 15 years ago. The surgery required him to do frequent dressing changes after the surgery. He has not had any problems until earlier this year when he began developing pain and swelling at the site of the surgery. He did have some purulent drainage and was started on antibiotic. He states that he just got it filled yesterday and started taking it. The drainage seems to have decreased in amount, though it was somewhat bloody in nature. He has no fever or chills. He does have some tenderness over the coccyx. He currently has a 3 out of 10 pain. Past Medical History:  Diagnosis Date  . Acid reflux   . Esophagitis    eosinic   . Hypertension   . Seasonal allergies     Past Surgical History:  Procedure Laterality Date  . ESOPHAGOGASTRODUODENOSCOPY    . TUMOR REMOVAL     RT testicle  . WISDOM TOOTH EXTRACTION      History reviewed. No pertinent family history.  Current Outpatient Prescriptions on File Prior to Visit  Medication Sig Dispense Refill  . aspirin EC 81 MG tablet Take 81 mg by mouth daily.    . diphenhydrAMINE (BENADRYL) 25 MG tablet Take 50 mg by mouth at bedtime.    . fluticasone (FLONASE) 50 MCG/ACT nasal spray Place 2 sprays into both nostrils daily.  0  . hydrochlorothiazide (HYDRODIURIL) 25 MG tablet Take 1 tablet by mouth daily.  0  . ibuprofen (ADVIL,MOTRIN) 200 MG tablet Take 400 mg by mouth every 6 (six) hours as needed for moderate pain.    Marland Kitchen loratadine (CLARITIN) 10 MG tablet Take 10 mg by mouth daily.    Marland Kitchen losartan (COZAAR) 100 MG tablet Take 1 tablet by mouth daily.  0  . Multiple Vitamin (MULTIVITAMIN WITH MINERALS) TABS tablet Take 1 tablet by mouth daily.    . Omega-3 Fatty Acids (FISH OIL PO) Take by mouth.     No current  facility-administered medications on file prior to visit.     Allergies  Allergen Reactions  . Peanut-Containing Drug Products Rash    History  Alcohol Use  . Yes    Comment: occ    History  Smoking Status  . Former Smoker  Smokeless Tobacco  . Never Used    Review of Systems  Constitutional: Negative.   HENT: Positive for sinus pain.   Eyes: Negative.   Respiratory: Negative.   Cardiovascular: Negative.   Gastrointestinal: Positive for heartburn.  Genitourinary: Negative.   Musculoskeletal: Positive for back pain.  Skin: Negative.   Neurological: Negative.   Endo/Heme/Allergies: Negative.   Psychiatric/Behavioral: Negative.     Objective   Vitals:   06/17/17 0955  BP: (!) 162/95  Pulse: 93  Resp: 18  Temp: 97.8 F (36.6 C)    Physical Exam  Constitutional: He is oriented to person, place, and time and well-developed, well-nourished, and in no distress. No distress.  HENT:  Head: Normocephalic and atraumatic.  Cardiovascular: Normal rate, regular rhythm and normal heart sounds.   No murmur heard. Pulmonary/Chest: Effort normal and breath sounds normal. He has no wheezes. He has no rales.  Neurological: He is alert and oriented to person, place, and time.  Skin:  Thickened scar tissue noted over coccyx with no significant drainage or fluctuance  present. Mild erythema noted.  Vitals reviewed.  [facility]  Pilonidal cyst infection, resolving Plan   No need for acute surgical intervention at this time. He was told to finish his antibiotic course. As this recurrence has been so long after the surgery, hopefully this will not recur again. She did recur, he was instructed to follow-up with me. The surgery would be complicated due to his irregular healing over the coccyx. He understands this and agrees. Follow-up as needed.

## 2017-06-17 NOTE — Patient Instructions (Signed)
Pilonidal Cyst A pilonidal cyst is a fluid-filled sac. It forms beneath the skin near your tailbone, at the top of the crease of your buttocks. A pilonidal cyst that is not large or infected may not cause symptoms or problems. If the cyst becomes irritated or infected, it may fill with pus. This causes pain and swelling (pilonidal abscess). An infected cyst may need to be treated with medicine, drained, or removed. What are the causes? The cause of a pilonidal cyst is not known. One cause may be a hair that grows into your skin (ingrown hair). What increases the risk? Pilonidal cysts are more common in boys and men. Risk factors include:  Having lots of hair near the crease of the buttocks.  Being overweight.  Having a pilonidal dimple.  Wearing tight clothing.  Not bathing or showering frequently.  Sitting for long periods of time.  What are the signs or symptoms? Signs and symptoms of a pilonidal cyst may include:  Redness.  Pain and tenderness.  Warmth.  Swelling.  Pus.  Fever.  How is this diagnosed? Your health care provider may diagnose a pilonidal cyst based on your symptoms and a physical exam. The health care provider may do a blood test to check for infection. If your cyst is draining pus, your health care provider may take a sample of the drainage to be tested at a laboratory. How is this treated? Surgery is the usual treatment for an infected pilonidal cyst. You may also have to take medicines before surgery. The type of surgery you have depends on the size and severity of the infected cyst. The different kinds of surgery include:  Incision and drainage. This is a procedure to open and drain the cyst.  Marsupialization. In this procedure, a large cyst or abscess may be opened and kept open by stitching the edges of the skin to the cyst walls.  Cyst removal. This procedure involves opening the skin and removing all or part of the cyst.  Follow these  instructions at home:  Follow all of your surgeon's instructions carefully if you had surgery.  Take medicines only as directed by your health care provider.  If you were prescribed an antibiotic medicine, finish it all even if you start to feel better.  Keep the area around your pilonidal cyst clean and dry.  Clean the area as directed by your health care provider. Pat the area dry with a clean towel. Do not rub it as this may cause bleeding.  Remove hair from the area around the cyst as directed by your health care provider.  Do not wear tight clothing or sit in one place for long periods of time.  There are many different ways to close and cover an incision, including stitches, skin glue, and adhesive strips. Follow your health care provider's instructions on: ? Incision care. ? Bandage (dressing) changes and removal. ? Incision closure removal. Contact a health care provider if:  You have drainage, redness, swelling, or pain at the site of the cyst.  You have a fever. This information is not intended to replace advice given to you by your health care provider. Make sure you discuss any questions you have with your health care provider. Document Released: 12/04/2000 Document Revised: 05/14/2016 Document Reviewed: 04/26/2014 Elsevier Interactive Patient Education  2018 Elsevier Inc.  

## 2017-06-22 ENCOUNTER — Encounter: Payer: Self-pay | Admitting: General Surgery

## 2017-06-22 ENCOUNTER — Ambulatory Visit (INDEPENDENT_AMBULATORY_CARE_PROVIDER_SITE_OTHER): Payer: BLUE CROSS/BLUE SHIELD | Admitting: General Surgery

## 2017-06-22 VITALS — BP 163/99 | HR 104 | Temp 98.7°F | Resp 18 | Ht 73.0 in | Wt 311.0 lb

## 2017-06-22 DIAGNOSIS — L0501 Pilonidal cyst with abscess: Secondary | ICD-10-CM | POA: Diagnosis not present

## 2017-06-22 MED ORDER — SULFAMETHOXAZOLE-TRIMETHOPRIM 800-160 MG PO TABS: 1.0000 | ORAL_TABLET | Freq: Two times a day (BID) | ORAL | 0 refills | Status: DC

## 2017-06-22 MED ORDER — HYDROCODONE-ACETAMINOPHEN 5-325 MG PO TABS: 1.0000 | ORAL_TABLET | ORAL | 0 refills | Status: AC | PRN

## 2017-06-23 NOTE — Progress Notes (Signed)
Subjective:     Brandon Monroe  Patient returns as he has had increasing swelling and pain at the pilonidal cyst site. Objective:    BP (!) 163/99   Pulse (!) 104   Temp 98.7 F (37.1 C)   Resp 18   Ht 6\' 1"  (1.854 m)   Wt (!) 311 lb (141.1 kg)   BMI 41.03 kg/m   General:  alert, cooperative and no distress  Pilonidal site with fluctuance present along the upper aspect of the surgical scar. Procedure note: Informed consent obtained from the patient. The pilonidal region was prepped with Betadine. Contact freeze was used for anesthesia. Incision and drainage of the area was performed.  Fluid was expressed. It was cleaned. Patient tolerated the procedure well.     Assessment:    Recurrent pilonidal cyst with abscess    Plan:   I have reordered the patient's Bactrim. He should keep the area clean and dry. Will follow up as needed should the infection recur. He understands this and agrees.

## 2017-08-24 ENCOUNTER — Other Ambulatory Visit: Payer: Self-pay | Admitting: General Surgery

## 2017-08-24 MED ORDER — SULFAMETHOXAZOLE-TRIMETHOPRIM 800-160 MG PO TABS: 1.0000 | ORAL_TABLET | Freq: Two times a day (BID) | ORAL | 0 refills | Status: DC

## 2018-09-08 ENCOUNTER — Encounter: Payer: Self-pay | Admitting: General Surgery

## 2018-09-08 ENCOUNTER — Ambulatory Visit: Payer: BLUE CROSS/BLUE SHIELD | Admitting: General Surgery

## 2018-09-08 VITALS — BP 164/95 | HR 82 | Temp 98.7°F | Resp 22 | Wt 332.0 lb

## 2018-09-08 DIAGNOSIS — L0591 Pilonidal cyst without abscess: Secondary | ICD-10-CM

## 2018-09-08 MED ORDER — SULFAMETHOXAZOLE-TRIMETHOPRIM 800-160 MG PO TABS: 1.0000 | ORAL_TABLET | Freq: Two times a day (BID) | ORAL | 0 refills | Status: DC

## 2018-09-08 NOTE — Progress Notes (Signed)
Brandon FlesherDonald R Monroe; [id number]; 10/24/1971   HPI Patient is a 47 year old white male who was referred back to my care by Terie PurserSamantha Jackson for evaluation and treatment of recurrent pilonidal cyst.  I last saw him in my office in 2018.  He did not want surgery at that time.  He had a pilonidal cyst excised many years ago.  He states he has had 1-2 episodes of recurrence of the inflammation since I last saw him.  It did resolve with antibiotics.  He currently has a pain level 6 out of 10.  He denies any fever or chills.  Serosanguineous drainage has been noted.  No purulent drainage has been noted. Past Medical History:  Diagnosis Date  . Acid reflux   . Esophagitis    eosinic   . Hypertension   . Seasonal allergies     Past Surgical History:  Procedure Laterality Date  . ESOPHAGOGASTRODUODENOSCOPY    . TUMOR REMOVAL     RT testicle  . WISDOM TOOTH EXTRACTION      History reviewed. No pertinent family history.  Current Outpatient Medications on File Prior to Visit  Medication Sig Dispense Refill  . aspirin EC 81 MG tablet Take 81 mg by mouth daily.    . diphenhydrAMINE (BENADRYL) 25 MG tablet Take 50 mg by mouth at bedtime.    . hydrochlorothiazide (HYDRODIURIL) 25 MG tablet Take 1 tablet by mouth daily.  0  . ibuprofen (ADVIL,MOTRIN) 200 MG tablet Take 400 mg by mouth every 6 (six) hours as needed for moderate pain.    Marland Kitchen. loratadine (CLARITIN) 10 MG tablet Take 10 mg by mouth daily.    Marland Kitchen. losartan (COZAAR) 100 MG tablet Take 1 tablet by mouth daily.  0  . metoprolol succinate (TOPROL-XL) 25 MG 24 hr tablet Take 12.5 mg by mouth daily.    . Multiple Vitamin (MULTIVITAMIN WITH MINERALS) TABS tablet Take 1 tablet by mouth daily.    . Omega-3 Fatty Acids (FISH OIL PO) Take by mouth.    . fluticasone (FLONASE) 50 MCG/ACT nasal spray Place 2 sprays into both nostrils daily.  0   No current facility-administered medications on file prior to visit.     Allergies  Allergen Reactions  .  Peanut-Containing Drug Products Rash    Social History   Substance and Sexual Activity  Alcohol Use Yes   Comment: occ    Social History   Tobacco Use  Smoking Status Former Smoker  Smokeless Tobacco Never Used    Review of Systems  Constitutional: Negative.   HENT: Positive for sinus pain.   Eyes: Negative.   Respiratory: Negative.   Cardiovascular: Negative.   Gastrointestinal: Positive for heartburn.  Genitourinary: Negative.   Skin: Negative.   Neurological: Negative.   Endo/Heme/Allergies: Negative.   Psychiatric/Behavioral: Negative.     Objective   Vitals:   09/08/18 0910  BP: (!) 164/95  Pulse: 82  Resp: (!) 22  Temp: 98.7 F (37.1 C)    Physical Exam  Constitutional: He is oriented to person, place, and time. He appears well-developed and well-nourished. No distress.  HENT:  Head: Normocephalic and atraumatic.  Cardiovascular: Normal rate, regular rhythm and normal heart sounds. Exam reveals no gallop and no friction rub.  No murmur heard. Pulmonary/Chest: Effort normal and breath sounds normal. No stridor. No respiratory distress. He has no wheezes. He has no rales.  Neurological: He is alert and oriented to person, place, and time.  Skin: Skin is warm and dry.  Small area of induration and erythema to the right of the coccyx region.  No active drainage noted.  It is tender to touch.  Surgical scars noted in the buttock cleft which is healed.  No drainage noted from this region.  The area is approximately 2 cm in size.  Vitals reviewed.  Primary care notes reviewed Assessment  Recurrent infected pilonidal cyst Plan   Patient does not want surgical intervention at this time which is fine by me.  Has started on Bactrim DS 1 tablet twice a day for 2 weeks.  Will see him in 2 weeks for follow-up.

## 2018-09-22 ENCOUNTER — Ambulatory Visit: Payer: BLUE CROSS/BLUE SHIELD | Admitting: General Surgery

## 2018-09-22 ENCOUNTER — Encounter: Payer: Self-pay | Admitting: General Surgery

## 2018-09-22 VITALS — BP 143/88 | HR 81 | Temp 96.6°F | Resp 20 | Wt 327.0 lb

## 2018-09-22 DIAGNOSIS — L0501 Pilonidal cyst with abscess: Secondary | ICD-10-CM | POA: Diagnosis not present

## 2018-09-22 NOTE — Progress Notes (Signed)
Subjective:     Brandon Monroe  Here for follow-up wound check.  States he feels much better.  No drainage has been noted from pilonidal cyst.  No fevers have been noted.  He is just finishing his antibiotic course. Objective:    BP (!) 143/88 (BP Location: Left Arm, Patient Position: Sitting, Cuff Size: Large)   Pulse 81   Temp (!) 96.6 F (35.9 C) (Temporal)   Resp 20   Wt (!) 327 lb (148.3 kg)   BMI 43.14 kg/m   General:  alert, cooperative and no distress  Pilonidal region healed.  No fluctuance noted.  No drainage is noted.     Assessment:    Pilonidal cyst with infection, resolved    Plan:   No surgery indicated.  Follow-up as needed.  Patient was instructed to give me a call should he have a flareup again.

## 2019-09-28 ENCOUNTER — Ambulatory Visit: Payer: BC Managed Care – PPO | Admitting: General Surgery

## 2019-09-28 ENCOUNTER — Encounter: Payer: Self-pay | Admitting: General Surgery

## 2019-09-28 ENCOUNTER — Other Ambulatory Visit: Payer: Self-pay

## 2019-09-28 VITALS — BP 150/83 | HR 89 | Temp 96.9°F | Resp 18 | Ht 73.0 in | Wt 351.0 lb

## 2019-09-28 DIAGNOSIS — L0591 Pilonidal cyst without abscess: Secondary | ICD-10-CM

## 2019-09-28 MED ORDER — SULFAMETHOXAZOLE-TRIMETHOPRIM 800-160 MG PO TABS: 1.0000 | ORAL_TABLET | Freq: Two times a day (BID) | ORAL | 1 refills | Status: DC

## 2019-09-28 NOTE — Patient Instructions (Signed)
Pilonidal Cyst  A pilonidal cyst is a fluid-filled sac that forms beneath the skin near the tailbone, at the top of the crease of the buttocks (pilonidal area). If the cyst is not large and not infected, it may not cause any problems. If the cyst becomes irritated or infected, it may get larger and fill with pus. An infected cyst is called an abscess. A pilonidal abscess may cause pain and swelling, and it may need to be drained or removed. What are the causes? The cause of this condition is not always known. In some cases, a hair that grows into your skin (ingrown hair) may be the cause. What increases the risk? You are more likely to get a pilonidal cyst if you:  Are male.  Have lots of hair near the crease of the buttocks.  Are overweight.  Have a dimple near the crease of the buttocks.  Wear tight clothing.  Do not bathe or shower often.  Sit for long periods of time. What are the signs or symptoms? Signs and symptoms of a pilonidal cyst may include pain, swelling, redness, and warmth in the pilonidal area. Depending on how big the cyst is, you may be able to feel a lump near your tailbone. If your cyst becomes infected, symptoms may include:  Pus or fluid drainage.  Fever.  Pain, swelling, and redness getting worse.  The lump getting bigger. How is this diagnosed? This condition may be diagnosed based on:  Your symptoms and medical history.  A physical exam.  A blood test to check for infection.  Testing a pus sample, if applicable. How is this treated? If your cyst does not cause symptoms, you may not need any treatment. If your cyst bothers you or is infected, you may need a procedure to drain or remove the cyst. Depending on the size, location, and severity of your cyst, your health care provider may:  Make an incision in the cyst and drain it (incision and drainage).  Open and drain the cyst, and then stitch the wound so that it stays open while it heals  (marsupialization). You will be given instructions about how to care for your open wound while it heals.  Remove all or part of the cyst, and then close the wound (cyst removal). You may need to take antibiotic medicines before your procedure. Follow these instructions at home: Medicines  Take over-the-counter and prescription medicines only as told by your health care provider.  If you were prescribed an antibiotic medicine, take it as told by your health care provider. Do not stop taking the antibiotic even if you start to feel better. General instructions  Keep the area around your pilonidal cyst clean and dry.  If there is fluid or pus draining from your cyst: ? Cover the area with a clean bandage (dressing) as needed. ? Wash the area gently with soap and water. Pat the area dry with a clean towel. Do not rub the area because that may cause bleeding.  Remove hair from the area around the cyst only if your health care provider tells you to do this.  Do not wear tight pants or sit in one position for long periods at a time.  Keep all follow-up visits as told by your health care provider. This is important. Contact a health care provider if you have:  New redness, swelling, or pain.  A fever.  Severe pain. Summary  A pilonidal cyst is a fluid-filled sac that forms beneath the   skin near the tailbone, at the top of the crease of the buttocks (pilonidal area).  If the cyst becomes irritated or infected, it may get larger and fill with pus. An infected cyst is called an abscess.  The cause of this condition is not always known. In some cases, a hair that grows into your skin (ingrown hair) may be the cause.  If your cyst does not cause symptoms, you may not need any treatment. If your cyst bothers you or is infected, you may need a procedure to drain or remove the cyst. This information is not intended to replace advice given to you by your health care provider. Make sure you  discuss any questions you have with your health care provider. Document Released: 12/04/2000 Document Revised: 11/25/2017 Document Reviewed: 11/25/2017 Elsevier Patient Education  2020 Elsevier Inc.  

## 2019-09-29 NOTE — Progress Notes (Signed)
Brandon Monroe; [id number]; 1971-05-02   HPI Patient is a 48 year old white male who presents back to my care for an infected pilonidal cyst.  I last saw him approximately 1 year ago.  He has had a longstanding history of pilonidal cyst complications.  He has had multiple surgeries in that area.  He states he has been doing well since I last saw him in October 2019.  He started having some swelling and drainage recently.  He was started on an antibiotic.  Since the antibiotic was started.  He is having less swelling and pain.  Drainage has stopped.  He currently has 0 out of 10 pain. Past Medical History:  Diagnosis Date  . Acid reflux   . Esophagitis    eosinic   . Hypertension   . Seasonal allergies     Past Surgical History:  Procedure Laterality Date  . ESOPHAGOGASTRODUODENOSCOPY    . TUMOR REMOVAL     RT testicle  . WISDOM TOOTH EXTRACTION      History reviewed. No pertinent family history.  Current Outpatient Medications on File Prior to Visit  Medication Sig Dispense Refill  . aspirin EC 81 MG tablet Take 81 mg by mouth daily.    . diphenhydrAMINE (BENADRYL) 25 MG tablet Take 50 mg by mouth at bedtime.    . fluticasone (FLONASE) 50 MCG/ACT nasal spray Place 2 sprays into both nostrils daily.  0  . hydrochlorothiazide (HYDRODIURIL) 25 MG tablet Take 1 tablet by mouth daily.  0  . ibuprofen (ADVIL,MOTRIN) 200 MG tablet Take 400 mg by mouth every 6 (six) hours as needed for moderate pain.    Marland Kitchen loratadine (CLARITIN) 10 MG tablet Take 10 mg by mouth daily.    Marland Kitchen losartan (COZAAR) 100 MG tablet Take 1 tablet by mouth daily.  0  . metoprolol succinate (TOPROL-XL) 25 MG 24 hr tablet Take 25 mg by mouth daily.     . Multiple Vitamin (MULTIVITAMIN WITH MINERALS) TABS tablet Take 1 tablet by mouth daily.    . Omega-3 Fatty Acids (FISH OIL PO) Take by mouth.     No current facility-administered medications on file prior to visit.     Allergies  Allergen Reactions  .  Peanut-Containing Drug Products Rash    Social History   Substance and Sexual Activity  Alcohol Use Yes   Comment: occ    Social History   Tobacco Use  Smoking Status Former Smoker  Smokeless Tobacco Never Used    Review of Systems  Constitutional: Negative.   HENT: Negative.   Eyes: Negative.   Respiratory: Negative.   Cardiovascular: Negative.   Gastrointestinal: Positive for heartburn.  Genitourinary: Negative.   Musculoskeletal: Negative.   Skin: Negative.   Neurological: Negative.   Endo/Heme/Allergies: Negative.   Psychiatric/Behavioral: Negative.     Objective   Vitals:   09/28/19 1022  BP: (!) 150/83  Pulse: 89  Resp: 18  Temp: (!) 96.9 F (36.1 C)  SpO2: 96%    Physical Exam Vitals signs reviewed.  Constitutional:      Appearance: Normal appearance. He is obese. He is not ill-appearing.  HENT:     Head: Normocephalic and atraumatic.  Cardiovascular:     Rate and Rhythm: Normal rate and regular rhythm.     Heart sounds: Normal heart sounds. No murmur. No friction rub. No gallop.   Pulmonary:     Effort: Pulmonary effort is normal. No respiratory distress.     Breath sounds: Normal breath sounds.  No stridor. No wheezing, rhonchi or rales.  Skin:    General: Skin is warm and dry.     Comments:   Slight erythema and swelling to the right of the coccyx region with multiple surgical scars present.  No purulent drainage is noted.  No significant fluctuance is noted.  Neurological:     Mental Status: He is alert and oriented to person, place, and time.     Assessment  Recurrent infected pilonidal cyst, resolving Plan   I told the patient that he should finish the antibiotic course.  He is resistant to having additional surgery at this time, which is fine with me.  As he knows, he has a complex pilonidal cyst issue requiring multiple surgeries in the past.  Should his infection flare back up, he was instructed to call my office.

## 2020-01-11 ENCOUNTER — Encounter: Payer: Self-pay | Admitting: General Surgery

## 2020-01-11 ENCOUNTER — Other Ambulatory Visit: Payer: Self-pay

## 2020-01-11 ENCOUNTER — Ambulatory Visit: Payer: 59 | Admitting: General Surgery

## 2020-01-11 VITALS — BP 160/84 | HR 88 | Temp 98.4°F | Resp 18 | Ht 73.0 in | Wt 351.0 lb

## 2020-01-11 DIAGNOSIS — L0591 Pilonidal cyst without abscess: Secondary | ICD-10-CM | POA: Diagnosis not present

## 2020-01-11 NOTE — Progress Notes (Signed)
Subjective:     Brandon Monroe  Here for recurrence of drainage from an area around his pilonidal cyst.  Patient states he has had clear red drainage from a lesion around the pilonidal region over the past month.  This is similar to previous episodes of drainage.  He denies any fevers.  He has not noted any purulent drainage.  He has been on antibiotics in the past for recurrence of infections in the pilonidal region.  He has had multiple surgeries on his pilonidal cyst in the past. Objective:    BP (!) 160/84 (BP Location: Left Arm, Patient Position: Sitting, Cuff Size: Normal)   Pulse 88   Temp 98.4 F (36.9 C) (Oral)   Resp 18   Ht 6\' 1"  (1.854 m)   Wt (!) 351 lb (159.2 kg)   SpO2 93%   BMI 46.31 kg/m   General:  alert, cooperative and no distress  Head is normocephalic, atraumatic Lungs are clear to auscultation with equal breath sounds bilaterally Heart examination reveals a regular rate and rhythm without S3, S4, murmurs Pilonidal region with multiple surgical scars present.  To the right of the midline, a small cystic area with pink drainage is present.  No purulent drainage is noted.  Silver nitrate was applied to the wound and the lesion was noted to be 5-3mm in depth without significant tracking.     Assessment:    Granuloma of pilonidal region   No infection at the present time.   Plan:   Keep wound clean and dry.  Follow up here in two weeks for management of the granuloma.

## 2020-01-17 ENCOUNTER — Ambulatory Visit: Payer: 59 | Attending: Internal Medicine

## 2020-01-17 ENCOUNTER — Other Ambulatory Visit: Payer: Self-pay

## 2020-01-17 DIAGNOSIS — Z20822 Contact with and (suspected) exposure to covid-19: Secondary | ICD-10-CM

## 2020-01-18 LAB — NOVEL CORONAVIRUS, NAA: SARS-CoV-2, NAA: NOT DETECTED

## 2020-01-25 ENCOUNTER — Encounter: Payer: Self-pay | Admitting: General Surgery

## 2020-01-25 ENCOUNTER — Other Ambulatory Visit: Payer: Self-pay

## 2020-01-25 ENCOUNTER — Ambulatory Visit: Payer: 59 | Admitting: General Surgery

## 2020-01-25 VITALS — BP 171/94 | HR 90 | Temp 96.0°F | Resp 16 | Ht 73.0 in | Wt 351.0 lb

## 2020-01-25 DIAGNOSIS — L0591 Pilonidal cyst without abscess: Secondary | ICD-10-CM

## 2020-01-26 NOTE — Progress Notes (Signed)
Subjective:     Brandon Monroe  Patient here for follow-up of pilonidal cyst.  He states the drainage has stopped.  He has no pain. Objective:    BP (!) 171/94 (BP Location: Left Arm, Patient Position: Sitting, Cuff Size: Large)   Pulse 90   Temp (!) 96 F (35.6 C) (Oral)   Resp 16   Ht 6\' 1"  (1.854 m)   Wt (!) 351 lb (159.2 kg)   SpO2 96%   BMI 46.31 kg/m   General:  alert, cooperative and no distress  Cyst to the right of the pilonidal region has healed.  No fluctuance is present.  No erythema is present.  No drainage is present.     Assessment:    Granuloma in area of pilonidal cyst surgery has healed.    Plan:   As patient has had multiple surgeries in this area, he would like to avoid surgical intervention at this time, which is fine with me.  He was instructed to follow-up with me as needed.

## 2020-01-31 ENCOUNTER — Ambulatory Visit (HOSPITAL_COMMUNITY)
Admission: RE | Admit: 2020-01-31 | Discharge: 2020-01-31 | Disposition: A | Discharge status: Home / Self Care | Payer: 59 | Source: Ambulatory Visit | Attending: Family Medicine | Admitting: Family Medicine

## 2020-01-31 ENCOUNTER — Other Ambulatory Visit: Payer: Self-pay

## 2020-01-31 ENCOUNTER — Other Ambulatory Visit (HOSPITAL_COMMUNITY): Payer: Self-pay | Admitting: Family Medicine

## 2020-01-31 DIAGNOSIS — M25531 Pain in right wrist: Secondary | ICD-10-CM

## 2020-05-23 ENCOUNTER — Other Ambulatory Visit: Payer: Self-pay

## 2020-05-23 ENCOUNTER — Ambulatory Visit (INDEPENDENT_AMBULATORY_CARE_PROVIDER_SITE_OTHER): Payer: 59 | Admitting: Internal Medicine

## 2020-05-23 ENCOUNTER — Encounter: Payer: Self-pay | Admitting: Internal Medicine

## 2020-05-23 VITALS — BP 146/80 | HR 84 | Ht 72.0 in | Wt 348.4 lb

## 2020-05-23 DIAGNOSIS — R0602 Shortness of breath: Secondary | ICD-10-CM

## 2020-05-23 NOTE — Progress Notes (Signed)
Cardiology Office Note   Date:  05/23/2020   ID:  Brandon Monroe, DOB 17-Dec-1971, MRN [id number]  PCP:  Shawnie Dapper, PA-C  Cardiologist:   Dietrich Pates, MD   Brandon Monroe referred by Dr Phillips Odor for assessment of SOB    History of Present Illness: Brandon Monroe is a 49 y.o. male with a history of DM, HTN, hyperlipidemia, asthma, obesity   Follows with B Mann and Dr Phillips Odor  at [facility]  The Brandon Monroe has no known hx of CAD   He thinks he may have sleep apnea and is being set up for sleep study He denies Co  Does not some SOB with strenuous exercise    Not with regular walking   Review of meds only recent change was addtion of metformin as A1C was over 7   Had been in 5s     Current Meds  Medication Sig  . aspirin EC 81 MG tablet Take 81 mg by mouth daily.  . diphenhydrAMINE (BENADRYL) 25 MG tablet Take 50 mg by mouth at bedtime.  . hydrochlorothiazide (HYDRODIURIL) 25 MG tablet Take 1 tablet by mouth daily.  Marland Kitchen ibuprofen (ADVIL,MOTRIN) 200 MG tablet Take 400 mg by mouth every 6 (six) hours as needed for moderate pain.  Marland Kitchen loratadine (CLARITIN) 10 MG tablet Take 10 mg by mouth daily.  Marland Kitchen losartan (COZAAR) 100 MG tablet Take 1 tablet by mouth daily.  . metFORMIN (GLUCOPHAGE) 500 MG tablet Take 500 mg by mouth 2 (two) times daily.  . metoprolol succinate (TOPROL-XL) 25 MG 24 hr tablet Take 25 mg by mouth daily.   . Multiple Vitamin (MULTIVITAMIN WITH MINERALS) TABS tablet Take 1 tablet by mouth daily.  . Omega-3 Fatty Acids (FISH OIL PO) Take by mouth.     Allergies:   Peanut-containing drug products   Past Medical History:  Diagnosis Date  . Acid reflux   . Esophagitis    eosinic   . Hypertension   . Seasonal allergies     Past Surgical History:  Procedure Laterality Date  . ESOPHAGOGASTRODUODENOSCOPY    . TUMOR REMOVAL     RT testicle  . WISDOM TOOTH EXTRACTION       Social History:  The patient  reports that he has quit smoking. He has never used  smokeless tobacco. He reports current alcohol use. He reports that he does not use drugs.  Hx of tob   10 years x 1 ppd  Quit 20 yrs    Family Historhee patient's family history is not on file.  Fathers family had heart issues  Mult family members with CAD Moms side of family:   HTN   Mother with CVA  ROS:  Please see the history of present illness. All other systems are reviewed bNega to the above problem except as noted.    PHYSICAL EXAM: VS:  BP (!) 146/80   Pulse 84   Ht 6' (1.829 m)   Wt (!) 348 lb 6.4 oz (158 kg)   SpO2 97%   BMI 47.25 kg/m   GEN: Morbdily obese 49 yo  in no acute distress  HEENT: normal  Neck: no JVD, carotid bruits Cardiac: RRR; no murmurs, rubs, or gallops,no LE edema  Respiratory:  clear to auscultation bilaterally, normal work of breathing GI: soft, nontender, nondistended, + BS  No hepatomegaly  MS: no deformity Moving all extremities   Skin: warm and dry, no rash Neuro:  Strength and sensation are intact Psych: euthymic  mood, full affect   EKG:  EKG is not ordered today.  Pending from Dr Cornelia Copa office   On 05/15/20:   SR 90 bpm     Lipid Panel No results found for: CHOL, TRIG, HDL, CHOLHDL, VLDL, LDLCALC, LDLDIRECT    Wt Readings from Last 3 Encounters:  05/23/20 (!) 348 lb 6.4 oz (158 kg)  01/25/20 (!) 351 lb (159.2 kg)  01/11/20 (!) 351 lb (159.2 kg)      ASSESSMENT AND PLAN:  1  Dyspnea  Chronic   Brandon Monroe notes no recnet change   May be related to obesity    I would recomm setting up for an echo to eval LV systolic/diastolic function  2  Cardiac risk stratification.   Associated with problem 1   If LVEF is normal / RVEF normal then I would recomm a CT calcium score to eval for atherosclerotic burden   Brandon Monroe agreeable If LVEF down then cath  3   HTN  BP is high   I would not change meds now but will need to keep track   Brandon Monroe to have sleep study   May be related to 1 as well  4.  HL   Brandon Monroe told total was 175   Will get labs from Dr Cornelia Copa  office   No chagne for now  NOte labs:  05/15/20:   LDL 121  HDL 36  Trig 99   Total 175 Further Rx will be based on CT findngs   5  DM   Brandon Monroe just started on metformin   Says it may be a fluke   Working on diet   Knows he has to lose wt  F/U based on test results       Current medicines are reviewed at length with the patient today.  The patient does not have concerns regarding medicines.  Signed, Dorris Carnes, MD  05/23/2020 1:26 PM    [facility] [address], [place], [place]  [address] Phone: [phone]; Fax: [phone]

## 2020-05-23 NOTE — Patient Instructions (Signed)
Medication Instructions:  ?Your physician recommends that you continue on your current medications as directed. Please refer to the Current Medication list given to you today. ? ?*If you need a refill on your cardiac medications before your next appointment, please call your pharmacy* ? ? ?Lab Work: ?NONE  ? ?If you have labs (blood work) drawn today and your tests are completely normal, you will receive your results only by: ?MyChart Message (if you have MyChart) OR ?A paper copy in the mail ?If you have any lab test that is abnormal or we need to change your treatment, we will call you to review the results. ? ? ?Testing/Procedures: ?Your physician has requested that you have an echocardiogram. Echocardiography is a painless test that uses sound waves to create images of your heart. It provides your doctor with information about the size and shape of your heart and how well your heart?s chambers and valves are working. This procedure takes approximately one hour. There are no restrictions for this procedure. ? ? ? ?Follow-Up: ?At [facility], you and your health needs are our priority.  As part of our continuing mission to provide you with exceptional heart care, we have created designated Provider Care Teams.  These Care Teams include your primary Cardiologist (physician) and Advanced Practice Providers (APPs -  Physician Assistants and Nurse Practitioners) who all work together to provide you with the care you need, when you need it. ? ?We recommend signing up for the patient portal called "MyChart".  Sign up information is provided on this After Visit Summary.  MyChart is used to connect with patients for Virtual Visits (Telemedicine).  Patients are able to view lab/test results, encounter notes, upcoming appointments, etc.  Non-urgent messages can be sent to your provider as well.   ?To learn more about what you can do with MyChart, go to [email or web address].   ? ?Your next appointment:   ? To Be  Determined  ? ?The format for your next appointment:   ?In Person ? ?Provider:   ?Paula Ross, MD  ? ? ?Other Instructions ?Thank you for choosing [facility]! ?  ?  ?

## 2020-06-04 ENCOUNTER — Other Ambulatory Visit: Payer: Self-pay

## 2020-06-04 ENCOUNTER — Ambulatory Visit (HOSPITAL_COMMUNITY)
Admission: RE | Admit: 2020-06-04 | Discharge: 2020-06-04 | Disposition: A | Discharge status: Home / Self Care | Payer: 59 | Source: Ambulatory Visit | Attending: Internal Medicine | Admitting: Internal Medicine

## 2020-06-04 DIAGNOSIS — R0602 Shortness of breath: Secondary | ICD-10-CM | POA: Diagnosis not present

## 2020-06-04 NOTE — Progress Notes (Signed)
*  PRELIMINARY RESULTS* Echocardiogram 2D Echocardiogram has been performed.  Brandon Monroe 06/04/2020, 1:54 PM

## 2020-06-11 ENCOUNTER — Telehealth: Payer: Self-pay | Admitting: *Deleted

## 2020-06-11 DIAGNOSIS — I2510 Atherosclerotic heart disease of native coronary artery without angina pectoris: Secondary | ICD-10-CM

## 2020-06-11 DIAGNOSIS — R0602 Shortness of breath: Secondary | ICD-10-CM

## 2020-06-11 NOTE — Telephone Encounter (Signed)
-----   Message from Lendon Ka, RN sent at 06/06/2020  3:29 PM EDT ----- Brandon Monroe patient.

## 2020-06-20 ENCOUNTER — Ambulatory Visit (INDEPENDENT_AMBULATORY_CARE_PROVIDER_SITE_OTHER): Payer: 59 | Admitting: General Surgery

## 2020-06-20 ENCOUNTER — Encounter: Payer: Self-pay | Admitting: General Surgery

## 2020-06-20 ENCOUNTER — Other Ambulatory Visit: Payer: Self-pay

## 2020-06-20 VITALS — BP 164/96 | HR 86 | Temp 98.9°F | Resp 18 | Ht 72.0 in | Wt 336.0 lb

## 2020-06-20 DIAGNOSIS — L0591 Pilonidal cyst without abscess: Secondary | ICD-10-CM

## 2020-06-20 NOTE — Patient Instructions (Signed)
Pilonidal Cyst  A pilonidal cyst is a fluid-filled sac that forms beneath the skin near the tailbone, at the top of the crease of the buttocks (pilonidal area). If the cyst is not large and not infected, it may not cause any problems. If the cyst becomes irritated or infected, it may get larger and fill with pus. An infected cyst is called an abscess. A pilonidal abscess may cause pain and swelling, and it may need to be drained or removed. What are the causes? The cause of this condition is not always known. In some cases, a hair that grows into your skin (ingrown hair) may be the cause. What increases the risk? You are more likely to get a pilonidal cyst if you:  Are male.  Have lots of hair near the crease of the buttocks.  Are overweight.  Have a dimple near the crease of the buttocks.  Wear tight clothing.  Do not bathe or shower often.  Sit for long periods of time. What are the signs or symptoms? Signs and symptoms of a pilonidal cyst may include pain, swelling, redness, and warmth in the pilonidal area. Depending on how big the cyst is, you may be able to feel a lump near your tailbone. If your cyst becomes infected, symptoms may include:  Pus or fluid drainage.  Fever.  Pain, swelling, and redness getting worse.  The lump getting bigger. How is this diagnosed? This condition may be diagnosed based on:  Your symptoms and medical history.  A physical exam.  A blood test to check for infection.  Testing a pus sample, if applicable. How is this treated? If your cyst does not cause symptoms, you may not need any treatment. If your cyst bothers you or is infected, you may need a procedure to drain or remove the cyst. Depending on the size, location, and severity of your cyst, your health care provider may:  Make an incision in the cyst and drain it (incision and drainage).  Open and drain the cyst, and then stitch the wound so that it stays open while it heals  (marsupialization). You will be given instructions about how to care for your open wound while it heals.  Remove all or part of the cyst, and then close the wound (cyst removal). You may need to take antibiotic medicines before your procedure. Follow these instructions at home: Medicines  Take over-the-counter and prescription medicines only as told by your health care provider.  If you were prescribed an antibiotic medicine, take it as told by your health care provider. Do not stop taking the antibiotic even if you start to feel better. General instructions  Keep the area around your pilonidal cyst clean and dry.  If there is fluid or pus draining from your cyst: ? Cover the area with a clean bandage (dressing) as needed. ? Wash the area gently with soap and water. Pat the area dry with a clean towel. Do not rub the area because that may cause bleeding.  Remove hair from the area around the cyst only if your health care provider tells you to do this.  Do not wear tight pants or sit in one position for long periods at a time.  Keep all follow-up visits as told by your health care provider. This is important. Contact a health care provider if you have:  New redness, swelling, or pain.  A fever.  Severe pain. Summary  A pilonidal cyst is a fluid-filled sac that forms beneath the   skin near the tailbone, at the top of the crease of the buttocks (pilonidal area).  If the cyst becomes irritated or infected, it may get larger and fill with pus. An infected cyst is called an abscess.  The cause of this condition is not always known. In some cases, a hair that grows into your skin (ingrown hair) may be the cause.  If your cyst does not cause symptoms, you may not need any treatment. If your cyst bothers you or is infected, you may need a procedure to drain or remove the cyst. This information is not intended to replace advice given to you by your health care provider. Make sure you  discuss any questions you have with your health care provider. Document Revised: 11/25/2017 Document Reviewed: 11/25/2017 Elsevier Patient Education  2020 Elsevier Inc.  

## 2020-06-21 ENCOUNTER — Inpatient Hospital Stay: Admission: RE | Admit: 2020-06-21 | Payer: 59 | Source: Ambulatory Visit

## 2020-06-21 ENCOUNTER — Encounter (HOSPITAL_COMMUNITY)
Admission: RE | Admit: 2020-06-21 | Discharge: 2020-06-21 | Disposition: A | Discharge status: Home / Self Care | Payer: 59 | Source: Ambulatory Visit | Attending: General Surgery | Admitting: General Surgery

## 2020-06-21 NOTE — Progress Notes (Signed)
Subjective:     Brandon Monroe  Patient returns with inflammation of his pilonidal cyst.  Patient states it intermittently drains bloody fluid.  It is somewhat uncomfortable when pressure is applied to it. Objective:    BP (!) 164/96   Pulse 86   Temp 98.9 F (37.2 C) (Oral)   Resp 18   Ht 6' (1.829 m)   Wt (!) 336 lb (152.4 kg)   SpO2 93%   BMI 45.57 kg/m   General:  alert, cooperative and no distress  Multiple surgical scars present over the pilonidal region.  Granulomatous tissue with induration noted over the coccyx and to the right.  No significant purulent drainage is noted.     Assessment:    Recurrent pilonidal cyst    Plan:   Patient is scheduled for excision of the pilonidal cyst on 06/26/2020.  The risks and benefits of the procedure including bleeding, infection, and recurrence of the cyst were fully explained to the patient, who gave informed consent.

## 2020-06-21 NOTE — H&P (Signed)
Brandon Monroe is an 49 y.o. male.   Chief Complaint: Recurrent pilonidal cyst HPI: Patient is a 49 year old white male with a history of recurrent inflammation and infection of of a pilonidal cyst who now presents for formal excision of a granulomatous area over the coccyx region.  He has been on multiple trials of antibiotics and has had drainage of the cyst in the past.  Past Medical History:  Diagnosis Date  . Acid reflux   . Esophagitis    eosinic   . Hypertension   . Seasonal allergies     Past Surgical History:  Procedure Laterality Date  . ESOPHAGOGASTRODUODENOSCOPY    . TUMOR REMOVAL     RT testicle  . WISDOM TOOTH EXTRACTION      No family history on file. Social History:  reports that he has quit smoking. He has never used smokeless tobacco. He reports current alcohol use. He reports that he does not use drugs.  Allergies:  Allergies  Allergen Reactions  . Peanut-Containing Drug Products Rash    No medications prior to admission.    No results found for this or any previous visit (from the past 48 hour(s)). No results found.  Review of Systems  Constitutional: Negative.   HENT: Negative.   Eyes: Negative.   Respiratory: Negative.   Cardiovascular: Negative.   Gastrointestinal: Negative.   Endocrine: Negative.   Genitourinary: Negative.   Musculoskeletal: Negative.   Allergic/Immunologic: Negative.   Neurological: Negative.   Hematological: Negative.   Psychiatric/Behavioral: Negative.     There were no vitals taken for this visit. Physical Exam Vitals reviewed.  Constitutional:      Appearance: Normal appearance. He is obese. He is not ill-appearing.  HENT:     Head: Normocephalic and atraumatic.  Cardiovascular:     Rate and Rhythm: Normal rate and regular rhythm.     Heart sounds: Normal heart sounds. No murmur heard.  No friction rub. No gallop.   Pulmonary:     Effort: Pulmonary effort is normal. No respiratory distress.     Breath  sounds: Normal breath sounds. No stridor. No wheezing, rhonchi or rales.  Skin:    General: Skin is warm and dry.     Comments:   Granulomatous open wound to the right of the coccyx.  It extends approximately 2 to 3 cm in length.  No purulent drainage is noted.  Induration in the subcutaneous tissue is present.  Multiple surgical scars present.  Neurological:     Mental Status: He is alert and oriented to person, place, and time.      Assessment/Plan Impression: Recurrent pilonidal cyst Plan: Patient is scheduled for excision of the pilonidal cyst on 06/26/2020.  The risks and benefits of the procedure including bleeding, infection, and the possibility of recurrence of the cyst were fully explained to the patient, who gave informed consent.  Brandon Macho, MD 06/21/2020, 6:57 AM

## 2020-06-25 ENCOUNTER — Other Ambulatory Visit (HOSPITAL_COMMUNITY)
Admission: RE | Admit: 2020-06-25 | Discharge: 2020-06-25 | Disposition: A | Discharge status: Home / Self Care | Payer: 59 | Source: Ambulatory Visit | Attending: General Surgery | Admitting: General Surgery

## 2020-06-25 ENCOUNTER — Other Ambulatory Visit: Payer: Self-pay

## 2020-06-25 DIAGNOSIS — Z01812 Encounter for preprocedural laboratory examination: Secondary | ICD-10-CM | POA: Insufficient documentation

## 2020-06-25 DIAGNOSIS — Z20822 Contact with and (suspected) exposure to covid-19: Secondary | ICD-10-CM | POA: Diagnosis not present

## 2020-06-25 LAB — SARS CORONAVIRUS 2 (TAT 6-24 HRS): SARS Coronavirus 2: NEGATIVE

## 2020-06-26 ENCOUNTER — Other Ambulatory Visit: Payer: Self-pay

## 2020-06-26 ENCOUNTER — Ambulatory Visit (HOSPITAL_COMMUNITY): Payer: 59 | Admitting: Anesthesiology

## 2020-06-26 ENCOUNTER — Encounter (HOSPITAL_COMMUNITY)
Admission: RE | Disposition: A | Discharge status: Home / Self Care | Payer: Self-pay | Source: Home / Self Care | Attending: General Surgery

## 2020-06-26 ENCOUNTER — Ambulatory Visit (HOSPITAL_COMMUNITY)
Admission: RE | Admit: 2020-06-26 | Discharge: 2020-06-26 | Disposition: A | Discharge status: Home / Self Care | Payer: 59 | Attending: General Surgery | Admitting: General Surgery

## 2020-06-26 ENCOUNTER — Encounter (HOSPITAL_COMMUNITY): Payer: Self-pay | Admitting: General Surgery

## 2020-06-26 DIAGNOSIS — I10 Essential (primary) hypertension: Secondary | ICD-10-CM | POA: Diagnosis not present

## 2020-06-26 DIAGNOSIS — Z87891 Personal history of nicotine dependence: Secondary | ICD-10-CM | POA: Diagnosis not present

## 2020-06-26 DIAGNOSIS — Z6841 Body Mass Index (BMI) 40.0 and over, adult: Secondary | ICD-10-CM | POA: Insufficient documentation

## 2020-06-26 DIAGNOSIS — G4730 Sleep apnea, unspecified: Secondary | ICD-10-CM | POA: Insufficient documentation

## 2020-06-26 DIAGNOSIS — L0501 Pilonidal cyst with abscess: Secondary | ICD-10-CM | POA: Diagnosis not present

## 2020-06-26 DIAGNOSIS — K219 Gastro-esophageal reflux disease without esophagitis: Secondary | ICD-10-CM | POA: Insufficient documentation

## 2020-06-26 DIAGNOSIS — L0591 Pilonidal cyst without abscess: Secondary | ICD-10-CM

## 2020-06-26 HISTORY — PX: PILONIDAL CYST EXCISION: SHX744

## 2020-06-26 LAB — GLUCOSE, CAPILLARY: Glucose-Capillary: 89 mg/dL (ref 70–99)

## 2020-06-26 SURGERY — EXCISION, PILONIDAL CYST, EXTENSIVE
Anesthesia: General | Site: Coccyx

## 2020-06-26 MED ORDER — CHLORHEXIDINE GLUCONATE CLOTH 2 % EX PADS: 6.0000 | MEDICATED_PAD | Freq: Once | CUTANEOUS | Status: DC

## 2020-06-26 MED ORDER — 0.9 % SODIUM CHLORIDE (POUR BTL) OPTIME
TOPICAL | Status: DC | PRN
  Administered 2020-06-26: 1000 mL

## 2020-06-26 MED ORDER — ONDANSETRON HCL 4 MG/2ML IJ SOLN
INTRAMUSCULAR | Status: AC
  Filled 2020-06-26: qty 2

## 2020-06-26 MED ORDER — PROPOFOL 10 MG/ML IV BOLUS
INTRAVENOUS | Status: AC
  Filled 2020-06-26: qty 40

## 2020-06-26 MED ORDER — BUPIVACAINE LIPOSOME 1.3 % IJ SUSP
INTRAMUSCULAR | Status: DC | PRN
  Administered 2020-06-26: 15 mL

## 2020-06-26 MED ORDER — EPHEDRINE 5 MG/ML INJ
INTRAVENOUS | Status: AC
  Filled 2020-06-26: qty 10

## 2020-06-26 MED ORDER — MIDAZOLAM HCL 2 MG/2ML IJ SOLN
INTRAMUSCULAR | Status: AC
  Filled 2020-06-26: qty 2

## 2020-06-26 MED ORDER — BUPIVACAINE LIPOSOME 1.3 % IJ SUSP
INTRAMUSCULAR | Status: AC
  Filled 2020-06-26: qty 20

## 2020-06-26 MED ORDER — SUCCINYLCHOLINE CHLORIDE 200 MG/10ML IV SOSY
PREFILLED_SYRINGE | INTRAVENOUS | Status: AC
  Filled 2020-06-26: qty 10

## 2020-06-26 MED ORDER — ONDANSETRON HCL 4 MG/2ML IJ SOLN
INTRAMUSCULAR | Status: DC | PRN
  Administered 2020-06-26: 4 mg via INTRAVENOUS

## 2020-06-26 MED ORDER — PROPOFOL 10 MG/ML IV BOLUS
INTRAVENOUS | Status: AC
  Filled 2020-06-26: qty 20

## 2020-06-26 MED ORDER — LACTATED RINGERS IV SOLN
INTRAVENOUS | Status: DC | PRN
Start: 2020-06-26 — End: 2020-06-26

## 2020-06-26 MED ORDER — POVIDONE-IODINE 10 % EX OINT
TOPICAL_OINTMENT | CUTANEOUS | Status: AC
  Filled 2020-06-26: qty 1

## 2020-06-26 MED ORDER — KETOROLAC TROMETHAMINE 30 MG/ML IJ SOLN
30.0000 mg | Freq: Once | INTRAMUSCULAR | Status: AC
  Administered 2020-06-26: 30 mg via INTRAVENOUS
  Filled 2020-06-26: qty 1

## 2020-06-26 MED ORDER — PROPOFOL 10 MG/ML IV BOLUS
INTRAVENOUS | Status: DC | PRN
  Administered 2020-06-26: 250 mg via INTRAVENOUS

## 2020-06-26 MED ORDER — LIDOCAINE 2% (20 MG/ML) 5 ML SYRINGE
INTRAMUSCULAR | Status: AC
  Filled 2020-06-26: qty 5

## 2020-06-26 MED ORDER — LIDOCAINE HCL (PF) 1 % IJ SOLN
INTRAMUSCULAR | Status: AC
  Filled 2020-06-26: qty 30

## 2020-06-26 MED ORDER — SUCCINYLCHOLINE CHLORIDE 20 MG/ML IJ SOLN
INTRAMUSCULAR | Status: DC | PRN
  Administered 2020-06-26: 140 mg via INTRAVENOUS

## 2020-06-26 MED ORDER — FENTANYL CITRATE (PF) 100 MCG/2ML IJ SOLN
INTRAMUSCULAR | Status: DC | PRN
  Administered 2020-06-26: 50 ug via INTRAVENOUS

## 2020-06-26 MED ORDER — FENTANYL CITRATE (PF) 100 MCG/2ML IJ SOLN
INTRAMUSCULAR | Status: AC
  Filled 2020-06-26: qty 2

## 2020-06-26 MED ORDER — EPHEDRINE SULFATE 50 MG/ML IJ SOLN
INTRAMUSCULAR | Status: DC | PRN
Start: 2020-06-26 — End: 2020-06-26
  Administered 2020-06-26 (×2): 10 mg via INTRAVENOUS
  Administered 2020-06-26: 7.5 mg via INTRAVENOUS

## 2020-06-26 MED ORDER — CEFAZOLIN SODIUM-DEXTROSE 1-4 GM/50ML-% IV SOLN
1.0000 g | Freq: Once | INTRAVENOUS | Status: AC
  Administered 2020-06-26: 1 g via INTRAVENOUS
  Filled 2020-06-26: qty 50

## 2020-06-26 MED ORDER — POVIDONE-IODINE 10 % OINT PACKET
TOPICAL_OINTMENT | CUTANEOUS | Status: DC | PRN
  Administered 2020-06-26: 1 via TOPICAL

## 2020-06-26 MED ORDER — CEFAZOLIN SODIUM-DEXTROSE 2-4 GM/100ML-% IV SOLN: 2.0000 g | INTRAVENOUS | Status: DC

## 2020-06-26 MED ORDER — CEFAZOLIN SODIUM-DEXTROSE 2-4 GM/100ML-% IV SOLN
2.0000 g | Freq: Once | INTRAVENOUS | Status: AC
  Administered 2020-06-26: 2 g via INTRAVENOUS
  Filled 2020-06-26: qty 100

## 2020-06-26 MED ORDER — OXYCODONE-ACETAMINOPHEN 10-325 MG PO TABS: 1.0000 | ORAL_TABLET | Freq: Four times a day (QID) | ORAL | 0 refills | Status: DC | PRN

## 2020-06-26 MED ORDER — MIDAZOLAM HCL 5 MG/5ML IJ SOLN
INTRAMUSCULAR | Status: DC | PRN
  Administered 2020-06-26: 2 mg via INTRAVENOUS

## 2020-06-26 MED ORDER — LIDOCAINE 2% (20 MG/ML) 5 ML SYRINGE
INTRAMUSCULAR | Status: DC | PRN
  Administered 2020-06-26: 100 mg via INTRAVENOUS

## 2020-06-26 SURGICAL SUPPLY — 31 items
BLADE SURG SZ11 CARB STEEL (BLADE) ×2 IMPLANT
CANNULA VESSEL 3MM 2 BLNT TIP (CANNULA) ×2 IMPLANT
CLOTH BEACON ORANGE TIMEOUT ST (SAFETY) ×2 IMPLANT
COVER LIGHT HANDLE STERIS (MISCELLANEOUS) ×4 IMPLANT
COVER WAND RF STERILE (DRAPES) ×2 IMPLANT
DECANTER SPIKE VIAL GLASS SM (MISCELLANEOUS) ×2 IMPLANT
DRSG TEGADERM 2-3/8X2-3/4 SM (GAUZE/BANDAGES/DRESSINGS) ×2 IMPLANT
ELECT REM PT RETURN 9FT ADLT (ELECTROSURGICAL) ×2
ELECTRODE REM PT RTRN 9FT ADLT (ELECTROSURGICAL) ×1 IMPLANT
GAUZE SPONGE 4X4 12PLY STRL (GAUZE/BANDAGES/DRESSINGS) ×2 IMPLANT
GAUZE SPONGE 4X4 12PLY STRL LF (GAUZE/BANDAGES/DRESSINGS) ×2 IMPLANT
GAUZE XEROFORM 1X8 LF (GAUZE/BANDAGES/DRESSINGS) IMPLANT
GLOVE BIO SURGEON STRL SZ7 (GLOVE) ×2 IMPLANT
GLOVE BIOGEL PI IND STRL 7.0 (GLOVE) ×2 IMPLANT
GLOVE BIOGEL PI INDICATOR 7.0 (GLOVE) ×2
GLOVE SURG SS PI 7.5 STRL IVOR (GLOVE) ×2 IMPLANT
GOWN STRL REUS W/TWL LRG LVL3 (GOWN DISPOSABLE) ×4 IMPLANT
KIT TURNOVER KIT A (KITS) ×2 IMPLANT
MANIFOLD NEPTUNE II (INSTRUMENTS) ×2 IMPLANT
NEEDLE HYPO 18GX1.5 BLUNT FILL (NEEDLE) ×2 IMPLANT
NS IRRIG 1000ML POUR BTL (IV SOLUTION) ×2 IMPLANT
PACK MINOR (CUSTOM PROCEDURE TRAY) ×4 IMPLANT
PAD ABD 5X9 TENDERSORB (GAUZE/BANDAGES/DRESSINGS) ×2 IMPLANT
PAD ARMBOARD 7.5X6 YLW CONV (MISCELLANEOUS) ×2 IMPLANT
SET BASIN LINEN APH (SET/KITS/TRAYS/PACK) ×2 IMPLANT
SOL PREP PROV IODINE SCRUB 4OZ (MISCELLANEOUS) ×2 IMPLANT
SPONGE GAUZE 2X2 8PLY STRL LF (GAUZE/BANDAGES/DRESSINGS) ×2 IMPLANT
SPONGE LAP 18X18 RF (DISPOSABLE) ×2 IMPLANT
SUT PROLENE 3 0 PS 2 (SUTURE) ×4 IMPLANT
SYR 30ML LL (SYRINGE) ×2 IMPLANT
SYR CONTROL 10ML LL (SYRINGE) ×2 IMPLANT

## 2020-06-26 NOTE — Anesthesia Postprocedure Evaluation (Signed)
Anesthesia Post Note  Patient: Brandon Monroe  Procedure(s) Performed: CYST EXCISION PILONIDAL EXTENSIVE (N/A Coccyx)  Patient location during evaluation: Phase II Anesthesia Type: General Level of consciousness: awake and alert and patient cooperative Pain management: satisfactory to patient Vital Signs Assessment: post-procedure vital signs reviewed and stable Respiratory status: spontaneous breathing Cardiovascular status: stable Postop Assessment: no apparent nausea or vomiting Anesthetic complications: no   No complications documented.   Last Vitals:  Vitals:   06/26/20 1230 06/26/20 1248  BP: 112/73 130/89  Pulse: 86 87  Resp: 16 19  Temp:  37 C  SpO2: 94% 98%    Last Pain:  Vitals:   06/26/20 1248  TempSrc: Oral  PainSc: 0-No pain                 Joanne Salah

## 2020-06-26 NOTE — Discharge Instructions (Signed)
Wound Care, Adult Taking care of your wound properly can help to prevent pain, infection, and scarring. It can also help your wound to heal more quickly. How to care for your wound Wound care      Follow instructions from your health care provider about how to take care of your wound. Make sure you: ? Wash your hands with soap and water before you change the bandage (dressing). If soap and water are not available, use hand sanitizer. ? Change your dressing as told by your health care provider. ? Leave stitches (sutures), skin glue, or adhesive strips in place. These skin closures may need to stay in place for 2 weeks or longer. If adhesive strip edges start to loosen and curl up, you may trim the loose edges. Do not remove adhesive strips completely unless your health care provider tells you to do that.  Check your wound area every day for signs of infection. Check for: ? Redness, swelling, or pain. ? Fluid or blood. ? Warmth. ? Pus or a bad smell.  Ask your health care provider if you should clean the wound with mild soap and water. Doing this may include: ? Using a clean towel to pat the wound dry after cleaning it. Do not rub or scrub the wound. ? Applying a cream or ointment. Do this only as told by your health care provider. ? Covering the incision with a clean dressing.  Ask your health care provider when you can leave the wound uncovered.  Keep the dressing dry until your health care provider says it can be removed. Do not take baths, swim, use a hot tub, or do anything that would put the wound underwater until your health care provider approves. Ask your health care provider if you can take showers. You may only be allowed to take sponge baths. Medicines   If you were prescribed an antibiotic medicine, cream, or ointment, take or use the antibiotic as told by your health care provider. Do not stop taking or using the antibiotic even if your condition improves.  Take  over-the-counter and prescription medicines only as told by your health care provider. If you were prescribed pain medicine, take it 30 or more minutes before you do any wound care or as told by your health care provider. General instructions  Return to your normal activities as told by your health care provider. Ask your health care provider what activities are safe.  Do not scratch or pick at the wound.  Do not use any products that contain nicotine or tobacco, such as cigarettes and e-cigarettes. These may delay wound healing. If you need help quitting, ask your health care provider.  Keep all follow-up visits as told by your health care provider. This is important.  Eat a diet that includes protein, vitamin A, vitamin C, and other nutrient-rich foods to help the wound heal. ? Foods rich in protein include meat, dairy, beans, nuts, and other sources. ? Foods rich in vitamin A include carrots and dark green, leafy vegetables. ? Foods rich in vitamin C include citrus, tomatoes, and other fruits and vegetables. ? Nutrient-rich foods have protein, carbohydrates, fat, vitamins, or minerals. Eat a variety of healthy foods including vegetables, fruits, and whole grains. Contact a health care provider if:  You received a tetanus shot and you have swelling, severe pain, redness, or bleeding at the injection site.  Your pain is not controlled with medicine.  You have redness, swelling, or pain around the wound.    You have fluid or blood coming from the wound.  Your wound feels warm to the touch.  You have pus or a bad smell coming from the wound.  You have a fever or chills.  You are nauseous or you vomit.  You are dizzy. Get help right away if:  You have a red streak going away from your wound.  The edges of the wound open up and separate.  Your wound is bleeding, and the bleeding does not stop with gentle pressure.  You have a rash.  You faint.  You have trouble  breathing. Summary  Always wash your hands with soap and water before changing your bandage (dressing).  To help with healing, eat foods that are rich in protein, vitamin A, vitamin C, and other nutrients.  Check your wound every day for signs of infection. Contact your health care provider if you suspect that your wound is infected. This information is not intended to replace advice given to you by your health care provider. Make sure you discuss any questions you have with your health care provider. Document Revised: 03/27/2019 Document Reviewed: 06/23/2016 Elsevier Patient Education  2020 [employer]  

## 2020-06-26 NOTE — Anesthesia Procedure Notes (Signed)
Procedure Name: Intubation Date/Time: 06/26/2020 11:08 AM Performed by: Franco Nones, CRNA Pre-anesthesia Checklist: Patient identified, Emergency Drugs available, Suction available, Patient being monitored and Timeout performed Patient Re-evaluated:Patient Re-evaluated prior to induction Oxygen Delivery Method: Circle system utilized Preoxygenation: Pre-oxygenation with 100% oxygen Induction Type: IV induction and Cricoid Pressure applied Laryngoscope Size: Glidescope and 3 Grade View: Grade I Tube type: Oral Tube size: 7.5 mm Number of attempts: 1 Airway Equipment and Method: Video-laryngoscopy and Stylet Placement Confirmation: ETT inserted through vocal cords under direct vision,  positive ETCO2 and breath sounds checked- equal and bilateral Secured at: 23 cm Tube secured with: Tape Dental Injury: Teeth and Oropharynx as per pre-operative assessment

## 2020-06-26 NOTE — Anesthesia Preprocedure Evaluation (Addendum)
Anesthesia Evaluation  Patient identified by MRN, date of birth, ID band Patient awake    Reviewed: Allergy & Precautions, NPO status , Patient's Chart, lab work & pertinent test results, reviewed documented beta blocker date and time   History of Anesthesia Complications Negative for: history of anesthetic complications  Airway Mallampati: III  TM Distance: >3 FB Neck ROM: Full    Dental  (+) Dental Advisory Given, Teeth Intact Crownsx3:   Pulmonary sleep apnea , former smoker,    Pulmonary exam normal breath sounds clear to auscultation       Cardiovascular Exercise Tolerance: Good hypertension, Pt. on medications and Pt. on home beta blockers Normal cardiovascular exam Rhythm:Regular Rate:Normal     Neuro/Psych negative psych ROS   GI/Hepatic Neg liver ROS, GERD  Medicated and Controlled,  Endo/Other  Morbid obesity  Renal/GU negative Renal ROS  negative genitourinary   Musculoskeletal negative musculoskeletal ROS (+)   Abdominal   Peds negative pediatric ROS (+)  Hematology negative hematology ROS (+)   Anesthesia Other Findings   Reproductive/Obstetrics negative OB ROS                           Anesthesia Physical Anesthesia Plan  ASA: III  Anesthesia Plan: General   Post-op Pain Management:    Induction: Intravenous  PONV Risk Score and Plan: 3 and Ondansetron, Dexamethasone and Midazolam  Airway Management Planned: Oral ETT  Additional Equipment:   Intra-op Plan:   Post-operative Plan: Extubation in OR  Informed Consent: I have reviewed the patients History and Physical, chart, labs and discussed the procedure including the risks, benefits and alternatives for the proposed anesthesia with the patient or authorized representative who has indicated his/her understanding and acceptance.     Dental advisory given  Plan Discussed with: CRNA and Surgeon  Anesthesia  Plan Comments:         Anesthesia Quick Evaluation

## 2020-06-26 NOTE — Interval H&P Note (Signed)
History and Physical Interval Note:  06/26/2020 10:41 AM  Brandon Monroe  has presented today for surgery, with the diagnosis of Pilondial cyst.  The various methods of treatment have been discussed with the patient and family. After consideration of risks, benefits and other options for treatment, the patient has consented to  Procedure(s): CYST EXCISION PILONIDAL EXTENSIVE (N/A) as a surgical intervention.  The patient's history has been reviewed, patient examined, no change in status, stable for surgery.  I have reviewed the patient's chart and labs.  Questions were answered to the patient's satisfaction.     [name]

## 2020-06-26 NOTE — Transfer of Care (Signed)
Immediate Anesthesia Transfer of Care Note  Patient: Brandon Monroe  Procedure(s) Performed: CYST EXCISION PILONIDAL EXTENSIVE (N/A Coccyx)  Patient Location: PACU  Anesthesia Type:General  Level of Consciousness: awake and patient cooperative  Airway & Oxygen Therapy: Patient Spontanous Breathing and non-rebreather face mask  Post-op Assessment: Report given to RN and Post -op Vital signs reviewed and stable  Post vital signs: Reviewed and stable  Last Vitals:  Vitals Value Taken Time  BP 133/96 06/26/20 1207  Temp 98.6   Pulse 90 06/26/20 1208  Resp 12 06/26/20 1208  SpO2 96 % 06/26/20 1208  Vitals shown include unvalidated device data.  Last Pain:  Vitals:   06/26/20 0921  TempSrc: Oral  PainSc: 0-No pain         Complications: No complications documented.

## 2020-06-26 NOTE — Op Note (Signed)
Patient:  Brandon Monroe  DOB:  [birth year]  MRN:  [id number]   Preop Diagnosis: Recurrent pilonidal cyst  Postop Diagnosis: Same  Procedure: Excision of pilonidal cyst, complicated  Surgeon: [name], MD  Anes: General endotracheal  Indications: Patient is a 49 year old white male who has had multiple surgical procedures for pilonidal cyst who presents with recurrence of the pilonidal cyst to the right of midline.  The patient now presents for excision of the pilonidal cyst.  The risks and benefits of the procedure including bleeding, infection, and the possibility of recurrence of the pilonidal cyst were fully explained to the patient, who gave informed consent.  Procedure note: The patient was placed in the left lateral decubitus position after induction of general endotracheal anesthesia.  The coccyx area was prepped and draped using the usual sterile technique with Betadine.  Surgical site confirmation was performed.  Patient had a granulomatous draining wound within the previous surgical site just to the right of the coccyx.  There was induration in the area.  An elliptical, longitudinal incision was made around the granulomatous lesion.  The dissection was taken down to the coccyx.  There was a significant amount of granulomatous tissue at the base with some extension towards the midline.  A curette was used to remove this granulation tissue.  And bleeding was controlled using Bovie electrocautery.  Skin flaps on either side of the wound were made in order to facilitate a tension-free closure of the skin.  Exparel was instilled into the surrounding wound.  The skin was closed using a 3-0 Prolene vertical mattress suture.  Betadine ointment and dry sterile dressing were applied.  All tape and needle counts were correct at the end of the procedure.  The patient was extubated in the operating room and transferred to PACU in stable condition.  Complications: None  EBL:   Minimal   Specimen:  Pilonidal cyst

## 2020-06-26 NOTE — Interval H&P Note (Signed)
History and Physical Interval Note:  06/26/2020 10:35 AM  Brandon Monroe  has presented today for surgery, with the diagnosis of Pilondial cyst.  The various methods of treatment have been discussed with the patient and family. After consideration of risks, benefits and other options for treatment, the patient has consented to  Procedure(s): CYST EXCISION PILONIDAL EXTENSIVE (N/A) as a surgical intervention.  The patient's history has been reviewed, patient examined, no change in status, stable for surgery.  I have reviewed the patient's chart and labs.  Questions were answered to the patient's satisfaction.     [name]

## 2020-06-27 ENCOUNTER — Encounter (HOSPITAL_COMMUNITY): Payer: Self-pay | Admitting: General Surgery

## 2020-06-27 ENCOUNTER — Other Ambulatory Visit: Payer: 59

## 2020-06-27 LAB — SURGICAL PATHOLOGY

## 2020-07-04 ENCOUNTER — Other Ambulatory Visit: Payer: Self-pay

## 2020-07-04 ENCOUNTER — Ambulatory Visit
Admission: RE | Admit: 2020-07-04 | Discharge: 2020-07-04 | Disposition: A | Discharge status: Home / Self Care | Payer: Self-pay | Source: Ambulatory Visit | Attending: Internal Medicine | Admitting: Internal Medicine

## 2020-07-04 DIAGNOSIS — I2510 Atherosclerotic heart disease of native coronary artery without angina pectoris: Secondary | ICD-10-CM

## 2020-07-09 ENCOUNTER — Telehealth: Payer: Self-pay

## 2020-07-09 DIAGNOSIS — I2510 Atherosclerotic heart disease of native coronary artery without angina pectoris: Secondary | ICD-10-CM

## 2020-07-09 MED ORDER — ROSUVASTATIN CALCIUM 10 MG PO TABS
10.0000 mg | ORAL_TABLET | Freq: Every day | ORAL | 3 refills | Status: DC
Start: 2020-07-09 — End: 2021-08-15

## 2020-07-09 NOTE — Telephone Encounter (Signed)
-----   Message from Pricilla Riffle, MD sent at 07/09/2020  3:21 PM EDT ----- Reviewed labs with pt  LDL 122 With mild plaquing/calcifications on LAD recomm Crestor 10    Goal:   LDL below 90, toward 70  F/u lipids end of September 2021 with LFTs    Watch carbs/refined sugars   goal for tighter control

## 2020-07-09 NOTE — Telephone Encounter (Signed)
E-scribed crestor,mailed lab slip to patient

## 2020-07-11 ENCOUNTER — Encounter: Payer: Self-pay | Admitting: General Surgery

## 2020-07-11 ENCOUNTER — Other Ambulatory Visit: Payer: Self-pay

## 2020-07-11 ENCOUNTER — Ambulatory Visit (INDEPENDENT_AMBULATORY_CARE_PROVIDER_SITE_OTHER): Payer: Self-pay | Admitting: General Surgery

## 2020-07-11 VITALS — BP 136/79 | HR 80 | Temp 98.2°F | Resp 18 | Ht 72.0 in | Wt 335.0 lb

## 2020-07-11 DIAGNOSIS — Z09 Encounter for follow-up examination after completed treatment for conditions other than malignant neoplasm: Secondary | ICD-10-CM

## 2020-07-11 NOTE — Progress Notes (Signed)
Subjective:     Brandon Monroe  Here for postoperative follow-up, status post pilonidal cyst excision. Patient states he is doing well. He has had minimal drainage. Has been pinkish-yellow drainage. No purulent drainage has been present. He denies any fevers. Objective:    BP 136/79   Pulse 80   Temp 98.2 F (36.8 C) (Oral)   Resp 18   Ht 6' (1.829 m)   Wt (!) 335 lb (152 kg)   SpO2 94%   BMI 45.43 kg/m   General:  alert, cooperative and no distress  Incision is healing well. Sutures removed. Small superficial wound along the lower aspect of the incision which is healing by secondary intention. No purulent drainage present.     Assessment:    Doing well postoperatively.    Plan:   Instructed to keep wound clean and dry. Follow-up here if the drainage continues past the next few weeks.

## 2020-09-06 ENCOUNTER — Other Ambulatory Visit: Payer: Self-pay

## 2020-09-06 ENCOUNTER — Other Ambulatory Visit (HOSPITAL_COMMUNITY)
Admission: RE | Admit: 2020-09-06 | Discharge: 2020-09-06 | Disposition: A | Discharge status: Home / Self Care | Payer: 59 | Source: Ambulatory Visit | Attending: Internal Medicine | Admitting: Internal Medicine

## 2020-09-06 DIAGNOSIS — I2510 Atherosclerotic heart disease of native coronary artery without angina pectoris: Secondary | ICD-10-CM | POA: Diagnosis present

## 2020-09-06 LAB — HEPATIC FUNCTION PANEL
ALT: 23 U/L (ref 0–44)
AST: 21 U/L (ref 15–41)
Albumin: 3.9 g/dL (ref 3.5–5.0)
Alkaline Phosphatase: 74 U/L (ref 38–126)
Bilirubin, Direct: 0.1 mg/dL (ref 0.0–0.2)
Indirect Bilirubin: 0.5 mg/dL (ref 0.3–0.9)
Total Bilirubin: 0.6 mg/dL (ref 0.3–1.2)
Total Protein: 7.2 g/dL (ref 6.5–8.1)

## 2020-09-06 LAB — LIPID PANEL
Cholesterol: 114 mg/dL (ref 0–200)
HDL: 30 mg/dL — ABNORMAL LOW (ref >40)
LDL Cholesterol: 64 mg/dL (ref 0–99)
Total CHOL/HDL Ratio: 3.8 RATIO
Triglycerides: 101 mg/dL (ref <150)
VLDL: 20 mg/dL (ref 0–40)

## 2020-10-03 ENCOUNTER — Encounter: Payer: Self-pay | Admitting: General Surgery

## 2020-10-03 ENCOUNTER — Ambulatory Visit (INDEPENDENT_AMBULATORY_CARE_PROVIDER_SITE_OTHER): Payer: 59 | Admitting: General Surgery

## 2020-10-03 ENCOUNTER — Other Ambulatory Visit: Payer: Self-pay

## 2020-10-03 VITALS — BP 145/85 | HR 78 | Temp 98.8°F | Resp 18 | Ht 72.0 in | Wt 329.0 lb

## 2020-10-03 DIAGNOSIS — L0591 Pilonidal cyst without abscess: Secondary | ICD-10-CM | POA: Diagnosis not present

## 2020-10-03 NOTE — Progress Notes (Signed)
Subjective:     Brandon Monroe  Presents here with bloody drainage from the pilonidal region that started approximately 5 days ago.  He denies any fevers.  He has not seen purulent drainage. Objective:    BP (!) 145/85   Pulse 78   Temp 98.8 F (37.1 C) (Oral)   Resp 18   Ht 6' (1.829 m)   Wt (!) 329 lb (149.2 kg)   SpO2 97%   BMI 44.62 kg/m   General:  alert, cooperative and no distress  Pilonidal region with a small area of skin separation along the superior aspect of the previous incision site.  He does have a dimpling of soft tissue inferior to this, but it is not fluctuant and does not appear to be recurrence of the pilonidal cyst.  Silver nitrate was applied.  No induration is noted.  No erythema is noted.     Assessment:    Superficial bloody drainage from pilonidal region through previous wound.  This is noninfected.  Suspect this may have been a granulomatous wound that drained.    Plan:   No need for antibiotics.  Patient told to keep the wound clean and dry.  He will follow-up as needed if the drainage continues over the next week or so.  No need for surgical intervention.

## 2020-10-17 ENCOUNTER — Ambulatory Visit: Payer: 59 | Attending: Internal Medicine

## 2020-10-17 DIAGNOSIS — Z23 Encounter for immunization: Secondary | ICD-10-CM

## 2020-10-17 NOTE — Progress Notes (Signed)
   Covid-19 Vaccination Clinic  Name:  Brandon Monroe    MRN: [id number] DOB: [birth year]  10/17/2020  Brandon Monroe was observed post Covid-19 immunization for 15 minutes without incident. He was provided with Vaccine Information Sheet and instruction to access the V-Safe system.   Brandon Monroe was instructed to call 911 with any severe reactions post vaccine: Marland Kitchen Difficulty breathing  . Swelling of face and throat  . A fast heartbeat  . A bad rash all over body  . Dizziness and weakness

## 2021-02-11 ENCOUNTER — Other Ambulatory Visit: Payer: Self-pay

## 2021-02-11 ENCOUNTER — Ambulatory Visit (INDEPENDENT_AMBULATORY_CARE_PROVIDER_SITE_OTHER): Payer: 59 | Admitting: General Surgery

## 2021-02-11 ENCOUNTER — Ambulatory Visit: Payer: 59 | Admitting: General Surgery

## 2021-02-11 ENCOUNTER — Encounter: Payer: Self-pay | Admitting: General Surgery

## 2021-02-11 VITALS — BP 162/83 | HR 86 | Temp 97.5°F | Resp 18 | Ht 72.0 in | Wt 346.0 lb

## 2021-02-11 DIAGNOSIS — L0591 Pilonidal cyst without abscess: Secondary | ICD-10-CM

## 2021-02-11 NOTE — Progress Notes (Signed)
Subjective:     Brandon Monroe  Presents back with a wound that is draining blood from his previous pilonidal excision site.  He denies any fevers. Objective:    BP (!) 162/83    Pulse 86    Temp (!) 97.5 F (36.4 C) (Other (Comment))    Resp 18    Ht 6' (1.829 m)    Wt (!) 346 lb (156.9 kg)    SpO2 97%    BMI 46.93 kg/m   General:  alert, cooperative and no distress  Pilonidal area with small punctate wound with dried blood present.  Silver nitrate applied to the subcutaneous tissue.  Tolerated procedure well.     Assessment:    Recurrent pilonidal cyst with granuloma, status post silver nitrate application    Plan:   Keep wound clean and dry.  Follow-up here in 1 week for wound check.

## 2021-02-18 ENCOUNTER — Encounter: Payer: Self-pay | Admitting: General Surgery

## 2021-02-18 ENCOUNTER — Other Ambulatory Visit: Payer: Self-pay

## 2021-02-18 ENCOUNTER — Ambulatory Visit (INDEPENDENT_AMBULATORY_CARE_PROVIDER_SITE_OTHER): Payer: 59 | Admitting: General Surgery

## 2021-02-18 VITALS — BP 162/80 | HR 88 | Temp 98.3°F | Resp 18 | Ht 72.0 in | Wt 346.0 lb

## 2021-02-18 DIAGNOSIS — L0591 Pilonidal cyst without abscess: Secondary | ICD-10-CM

## 2021-02-18 NOTE — Progress Notes (Signed)
Subjective:     Brandon Monroe  Here for follow-up wound check.  He has had episodes of bloody drainage, but it has decreased over time. Objective:    BP (!) 162/80   Pulse 88   Temp 98.3 F (36.8 C) (Other (Comment))   Resp 18   Ht 6' (1.829 m)   Wt (!) 346 lb (156.9 kg)   SpO2 96%   BMI 46.93 kg/m   General:  alert, cooperative and no distress  Pilonidal wound has closed up.  No drainage noted.  No erythema noted.     Assessment:    Recurrent pilonidal cyst, wound has healed over.  No need for surgical intervention at this time.    Plan:   Keep wound clean and dry.  Return if the wound opens back up.  Avoid trauma to that area.  Follow-up as needed.

## 2021-03-03 ENCOUNTER — Ambulatory Visit
Admission: RE | Admit: 2021-03-03 | Discharge: 2021-03-03 | Disposition: A | Discharge status: Home / Self Care | Payer: 59 | Source: Ambulatory Visit | Attending: Family Medicine | Admitting: Family Medicine

## 2021-03-03 ENCOUNTER — Other Ambulatory Visit: Payer: Self-pay

## 2021-03-03 VITALS — BP 150/96 | HR 77 | Temp 98.8°F | Resp 19

## 2021-03-03 DIAGNOSIS — R197 Diarrhea, unspecified: Secondary | ICD-10-CM | POA: Diagnosis not present

## 2021-03-03 DIAGNOSIS — R109 Unspecified abdominal pain: Secondary | ICD-10-CM

## 2021-03-03 MED ORDER — ALUM & MAG HYDROXIDE-SIMETH 200-200-20 MG/5ML PO SUSP
30.0000 mL | Freq: Once | ORAL | Status: AC
  Administered 2021-03-03: 30 mL via ORAL

## 2021-03-03 MED ORDER — DICYCLOMINE HCL 20 MG PO TABS
20.0000 mg | ORAL_TABLET | Freq: Three times a day (TID) | ORAL | 0 refills | Status: DC
Start: 2021-03-03 — End: 2021-09-08

## 2021-03-03 MED ORDER — LIDOCAINE VISCOUS HCL 2 % MT SOLN
15.0000 mL | Freq: Once | OROMUCOSAL | Status: AC
  Administered 2021-03-03: 15 mL via ORAL

## 2021-03-03 MED ORDER — DIPHENOXYLATE-ATROPINE 2.5-0.025 MG PO TABS
1.0000 | ORAL_TABLET | Freq: Four times a day (QID) | ORAL | 0 refills | Status: DC | PRN
Start: 2021-03-03 — End: 2021-09-08

## 2021-03-03 NOTE — ED Provider Notes (Signed)
[facility]    CSN: [id number] Arrival date & time: 03/03/21  1045      History   Chief Complaint Chief Complaint  Patient presents with  . Diarrhea    HPI Brandon Monroe is a 50 y.o. male.   HPI  Patient presents today with 24 to 48 hours frequent loose stools with generalized abdominal cramping, persistent hyperactive sounds coming from her abdomen.  He has been without nausea or vomiting.  No fever.  Unaware of any sick contacts.  He had eaten out prior to onset of symptoms however denies eating any new foods or eating any foods that taste different from meals he had eaten in the past. History significant for esophagitis, and acid reflux. No known history of diverticular disease.  He has had persistent gas without belching. He has eaten a bland diet over the last day or so and drinking sports drinks and water.   Past Medical History:  Diagnosis Date  . Acid reflux   . Esophagitis    eosinic   . Hypertension   . Seasonal allergies     Patient Active Problem List   Diagnosis Date Noted  . Pilonidal cyst   . 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 FINGER SUP FB W/O MAJ OPEN WOUND&W/O MENTION INF 06/29/2008    Past Surgical History:  Procedure Laterality Date  . ESOPHAGOGASTRODUODENOSCOPY    . PILONIDAL CYST EXCISION N/A 06/26/2020   Procedure: CYST EXCISION PILONIDAL EXTENSIVE;  Surgeon: [name], MD;  Location: [facility];  Service: General;  Laterality: N/A;  . TUMOR REMOVAL     RT testicle  . WISDOM TOOTH EXTRACTION         Home Medications    Prior to Admission medications   Medication Sig Start Date End Date Taking? Authorizing Provider  aspirin EC 81 MG tablet Take 81 mg by mouth daily.    [provider]  diphenhydrAMINE (BENADRYL) 25 MG tablet Take 50 mg by mouth at bedtime as needed (sleep.).     [provider]  ibuprofen (ADVIL,MOTRIN) 200 MG tablet Take 400 mg by mouth every 6 (six) hours as needed for moderate pain.     [provider]  lansoprazole (PREVACID) 15 MG capsule Take 15 mg by mouth daily before breakfast.    [provider]  loratadine (CLARITIN) 10 MG tablet Take 10 mg by mouth daily.    [provider]  losartan-hydrochlorothiazide (HYZAAR) 100-25 MG tablet Take 1 tablet by mouth daily. 05/21/20   [provider]  metFORMIN (GLUCOPHAGE) 500 MG tablet Take 500 mg by mouth 2 (two) times daily. 05/15/20   [provider]  metoprolol succinate (TOPROL-XL) 25 MG 24 hr tablet Take 25 mg by mouth daily.     [provider]  Multiple Vitamin (MULTIVITAMIN WITH MINERALS) TABS tablet Take 1 tablet by mouth daily.    [provider]  Omega-3 Fatty Acids (FISH OIL PO) Take 4,000 mg by mouth daily.     [provider]  rosuvastatin (CRESTOR) 10 MG tablet Take 1 tablet (10 mg total) by mouth daily. 07/09/20 10/07/20  Pricilla Riffle, MD  triamcinolone cream (KENALOG) 0.1 % Apply 1 application topically in the morning and at bedtime. 03/05/20   [provider]    Family History No family history on file.  Social History Social History   Tobacco Use  . Smoking status: Former Games developer  . Smokeless tobacco: Never Used  Substance Use Topics  . Alcohol use:  Yes    Comment: occ  . Drug use: No     Allergies   Peanut-containing drug products   Review of Systems Review of Systems Pertinent negatives listed in HPI  Physical Exam Triage Vital Signs ED Triage Vitals  Enc Vitals Group     BP 03/03/21 1105 (!) 150/96     Pulse Rate 03/03/21 1105 77     Resp 03/03/21 1105 19     Temp 03/03/21 1105 98.8 F (37.1 C)     Temp Source 03/03/21 1105 Oral     SpO2 03/03/21 1105 96 %     Weight --      Height --      Head Circumference --      Peak Flow --      Pain Score 03/03/21 1103 0     Pain Loc --      Pain Edu? --      Excl. in GC? --    No data found.  Updated Vital Signs BP (!) 150/96 (BP Location: Right Arm)    Pulse 77   Temp 98.8 F (37.1 C) (Oral)   Resp 19   SpO2 96%   Visual Acuity Right Eye Distance:   Left Eye Distance:   Bilateral Distance:    Right Eye Near:   Left Eye Near:    Bilateral Near:     Physical Exam Constitutional:      General: He is not in acute distress.    Appearance: He is obese. He is ill-appearing. He is not toxic-appearing.  Cardiovascular:     Rate and Rhythm: Normal rate and regular rhythm.  Pulmonary:     Effort: Pulmonary effort is normal.     Breath sounds: Normal breath sounds and air entry.  Abdominal:     General: Abdomen is protuberant. Bowel sounds are increased. There is distension.     Tenderness: There is no abdominal tenderness.     Hernia: No hernia is present.  Musculoskeletal:     Cervical back: Normal range of motion and neck supple.  Neurological:     Mental Status: He is alert and oriented to person, place, and time.     GCS: GCS eye subscore is 4. GCS verbal subscore is 5. GCS motor subscore is 6.  Psychiatric:        Attention and Perception: Attention normal.        Mood and Affect: Mood normal.        Speech: Speech normal.     UC Treatments / Results  Labs (all labs ordered are listed, but only abnormal results are displayed) Labs Reviewed - No data to display  EKG   Radiology No results found.  Procedures Procedures (including critical care time)  Medications Ordered in UC Medications - No data to display  Initial Impression / Assessment and Plan / UC Course  I have reviewed the triage vital signs and the nursing notes.  Pertinent labs & imaging results that were available during my care of the patient were reviewed by me and considered in my medical decision making (see chart for details).    Acute diarrhea of unknown etiology with abdominal cramping.  Will cover today with Bentyl to reduce symptoms of GI cramping and reduce hyperactivity in the abdomen.  Will trial lomotil QID PRN for diarrhea. GI  cocktail given in clinic tolerated. Emphasized hold off on eating diary, spicy, acid, or fatty foods for 48 hours to promote GI Rest. ER precautions  if symptoms worsen.  Final Clinical Impressions(s) / UC Diagnoses   Final diagnoses:  Diarrhea, unspecified type  Abdominal cramping   Discharge Instructions   None    ED Prescriptions    Medication Sig Dispense Auth. Provider   dicyclomine (BENTYL) 20 MG tablet Take 1 tablet (20 mg total) by mouth 3 (three) times daily before meals. 20 tablet Bing Neighbors, FNP   diphenoxylate-atropine (LOMOTIL) 2.5-0.025 MG tablet Take 1 tablet by mouth 4 (four) times daily as needed for diarrhea or loose stools. 20 tablet Bing Neighbors, FNP     PDMP not reviewed this encounter.   Bing Neighbors, FNP 03/03/21 1136

## 2021-03-03 NOTE — ED Triage Notes (Addendum)
Diarrhea since Saturday morning.  Has had approx 10-15 bowel movements x 24 hours.  Pt is concerned about dehydration

## 2021-05-01 ENCOUNTER — Other Ambulatory Visit: Payer: Self-pay

## 2021-05-01 ENCOUNTER — Ambulatory Visit (INDEPENDENT_AMBULATORY_CARE_PROVIDER_SITE_OTHER): Payer: 59 | Admitting: General Surgery

## 2021-05-01 ENCOUNTER — Encounter: Payer: Self-pay | Admitting: General Surgery

## 2021-05-01 VITALS — BP 160/82 | HR 76 | Temp 98.2°F | Resp 14 | Ht 72.0 in | Wt 340.0 lb

## 2021-05-01 DIAGNOSIS — L0591 Pilonidal cyst without abscess: Secondary | ICD-10-CM

## 2021-05-01 NOTE — Patient Instructions (Signed)
Pilonidal Cyst  A pilonidal cyst is a fluid-filled sac that forms beneath the skin near the tailbone, at the top of the crease of the buttocks (pilonidal area). If the cyst is not large and not infected, it may not cause any problems. If the cyst becomes irritated or infected, it may get larger and fill with pus. An infected cyst is called an abscess. A pilonidal abscess may cause pain and swelling, and it may need to be drained or removed. What are the causes? The cause of this condition is not always known. In some cases, a hair that grows into your skin (ingrown hair) may be the cause. What increases the risk? You are more likely to get a pilonidal cyst if you:  Are male.  Have lots of hair near the crease of the buttocks.  Are overweight.  Have a dimple near the crease of the buttocks.  Wear tight clothing.  Do not bathe or shower often.  Sit for long periods of time. What are the signs or symptoms? Signs and symptoms of a pilonidal cyst may include pain, swelling, redness, and warmth in the pilonidal area. Depending on how big the cyst is, you may be able to feel a lump near your tailbone. If your cyst becomes infected, symptoms may include:  Pus or fluid drainage.  Fever.  Pain, swelling, and redness getting worse.  The lump getting bigger. How is this diagnosed? This condition may be diagnosed based on:  Your symptoms and medical history.  A physical exam.  A blood test to check for infection.  Testing a pus sample, if applicable. How is this treated? If your cyst does not cause symptoms, you may not need any treatment. If your cyst bothers you or is infected, you may need a procedure to drain or remove the cyst. Depending on the size, location, and severity of your cyst, your health care provider may:  Make an incision in the cyst and drain it (incision and drainage).  Open and drain the cyst, and then stitch the wound so that it stays open while it heals  (marsupialization). You will be given instructions about how to care for your open wound while it heals.  Remove all or part of the cyst, and then close the wound (cyst removal). You may need to take antibiotic medicines before your procedure. Follow these instructions at home: Medicines  Take over-the-counter and prescription medicines only as told by your health care provider.  If you were prescribed an antibiotic medicine, take it as told by your health care provider. Do not stop taking the antibiotic even if you start to feel better. General instructions  Keep the area around your pilonidal cyst clean and dry.  If there is fluid or pus draining from your cyst: ? Cover the area with a clean bandage (dressing) as needed. ? Wash the area gently with soap and water. Pat the area dry with a clean towel. Do not rub the area because that may cause bleeding.  Remove hair from the area around the cyst only if your health care provider tells you to do this.  Do not wear tight pants or sit in one position for long periods at a time.  Keep all follow-up visits as told by your health care provider. This is important. Contact a health care provider if you have:  New redness, swelling, or pain.  A pilonidal cyst is a fluid-filled sac that forms beneath the skin near the tailbone, at the top   of the crease of the buttocks (pilonidal area).  If the cyst becomes irritated or infected, it may get larger and fill with pus. An infected cyst is called an abscess.  The cause of this condition is not always known. In some cases, a hair that grows into your skin (ingrown hair) may be the cause.  If your cyst does not cause symptoms, you may not need any treatment. If your cyst bothers you or is infected, you may need a procedure to drain or remove the cyst. This information is not intended to replace advice given to you by your health care provider. Make sure you discuss any questions you have with your  health care provider. Document Revised: 11/25/2017 Document Reviewed: 11/25/2017 Elsevier Patient Education  2021 Elsevier Inc.  

## 2021-05-01 NOTE — Progress Notes (Signed)
Subjective:     Brandon Monroe  Here for follow-up of pilonidal cyst.  States he had a GI infection which caused significant diarrhea.  He was going to the bathroom frequently.  He hit the area of the pilonidal cyst and started draining.  Is only been draining bloody fluid over the past month.  Recently it started to decrease in amount.  He wanted me to check out the wound.  He has had no fevers. Objective:    BP (!) 160/82   Pulse 76   Temp 98.2 F (36.8 C) (Other (Comment))   Resp 14   Ht 6' (1.829 m)   Wt (!) 340 lb (154.2 kg)   SpO2 94%   BMI 46.11 kg/m   General:  alert, cooperative and no distress  Pilonidal region with healed small blistering granuloma to the right of the coccyx.  No erythema is noted.  Minimal induration is present.  No drainage is present.     Assessment:    Pilonidal cyst with recent flareup.  It is resolving on its own.  No need for antibiotics at this time.  Patient does not want surgical intervention at this time.    Plan:   Avoid trauma to that area.  Keep area clean and dry with soap and water.  Follow-up as needed.

## 2021-08-14 ENCOUNTER — Other Ambulatory Visit: Payer: Self-pay | Admitting: Internal Medicine

## 2021-09-07 NOTE — Progress Notes (Signed)
Cardiology Office Note  Date: 09/08/2021   ID: Brandon Monroe, DOB Jun 15, 1971, MRN [id number]  PCP:  Brandon Dapper, PA-C  Cardiologist:  None Electrophysiologist:  None   Chief Complaint: 1 year follow-up  History of Present Illness: Brandon Monroe is a 50 y.o. male with a history of hypertension, esophagitis, acid reflux, DM, HLD, asthma, obesity.  He was last seen by Dr. Tenny Craw on 05/23/2020 for shortness of breath.  Patient stated he thought he may have sleep apnea and was being set up for sleep study.  He did complain of some shortness of breath with strenuous exercise but not with regular walking.  Recent medication has been added which was metformin his A1c was over 7%.  Plan was to set up for an echocardiogram to evaluate LV, systolic and diastolic function.  Plan was once echo was done and LVEF was normal RV EF was normal then would recommend CT for calcium score to evaluate for atherosclerotic burden.  If LVEF was decreased, catheterization was to be considered.  Blood pressure was elevated.  Plan was to have patient keep track of his blood pressure and to have sleep study.  His lipid levels showed total cholesterol was 175.  Plan was to get labs from Dr. Phillips Odor.  Labs from 05/15/2020 showed LDL of 121, HDL 36, triglycerides 99, total 175.  Further treatment was be based on CT findings.  He had been recently started on metformin.   He is here today for follow-up.  He states the main reason he is here is he needed a follow-up to refill his statin medication.  In the interim since last being seen by Dr. Tenny Craw he denies any recent acute illnesses or hospitalizations.  He denies any anginal or exertional symptoms, orthostatic symptoms i.e. lightheadedness, dizziness, presyncopal or syncopal episodes.  Denies any CVA or TIA-like symptoms.  Blood pressure is elevated at 150/90.  He states his blood pressures at home are usually low 130s over 80s.  He denies any PND, orthopnea, DOE or SOB.   He states he knows he is overweight and needs to lose more weight and modify his dietary habits.  He denies any claudication-like symptoms, DVT or PE-like symptoms, or lower extremity edema.  He states he is currently on CPAP and is compliant.  His most recent labs show a decrease in his LDL to 64.   Past Medical History:  Diagnosis Date   Acid reflux    Esophagitis    eosinic    Hypertension    Seasonal allergies     Past Surgical History:  Procedure Laterality Date   ESOPHAGOGASTRODUODENOSCOPY     PILONIDAL CYST EXCISION N/A 06/26/2020   Procedure: CYST EXCISION PILONIDAL EXTENSIVE;  Surgeon: Brandon Macho, MD;  Location: [facility];  Service: General;  Laterality: N/A;   TUMOR REMOVAL     RT testicle   WISDOM TOOTH EXTRACTION      Current Outpatient Medications  Medication Sig Dispense Refill   albuterol (VENTOLIN HFA) 108 (90 Base) MCG/ACT inhaler SMARTSIG:1-2 Puff(s) By Mouth Every 4 Hours PRN     aspirin EC 81 MG tablet Take 81 mg by mouth daily.     diphenhydrAMINE (BENADRYL) 25 MG tablet Take 50 mg by mouth at bedtime as needed (sleep.).      ibuprofen (ADVIL,MOTRIN) 200 MG tablet Take 400 mg by mouth every 6 (six) hours as needed for moderate pain.     lansoprazole (PREVACID) 15 MG capsule Take 15 mg by  mouth daily before breakfast.     loratadine (CLARITIN) 10 MG tablet Take 10 mg by mouth daily.     losartan-hydrochlorothiazide (HYZAAR) 100-25 MG tablet Take 1 tablet by mouth daily.     metFORMIN (GLUCOPHAGE) 500 MG tablet Take 500 mg by mouth 2 (two) times daily.     metoprolol succinate (TOPROL-XL) 25 MG 24 hr tablet Take 25 mg by mouth daily.      Multiple Vitamin (MULTIVITAMIN WITH MINERALS) TABS tablet Take 1 tablet by mouth daily.     Omega-3 Fatty Acids (FISH OIL PO) Take 4,000 mg by mouth daily.      tadalafil (CIALIS) 5 MG tablet SMARTSIG:1-4 Tablet(s) By Mouth PRN     triamcinolone cream (KENALOG) 0.1 % Apply 1 application topically in the morning and at bedtime.      rosuvastatin (CRESTOR) 10 MG tablet Take 1 tablet (10 mg total) by mouth daily. 90 tablet 3   No current facility-administered medications for this visit.   Allergies:  Peanut-containing drug products   Social History: The patient  reports that he has quit smoking. He has never used smokeless tobacco. He reports current alcohol use. He reports that he does not use drugs.   Family History: The patient's family history is not on file.   ROS:  Please see the history of present illness. Otherwise, complete review of systems is positive for none.  All other systems are reviewed and negative.   Physical Exam: VS:  BP (!) 150/90 (BP Location: Left Arm, Patient Position: Sitting, Cuff Size: Large)   Pulse 83   Ht 6' (1.829 m)   Wt (!) 357 lb 9.6 oz (162.2 kg)   SpO2 98%   BMI 48.50 kg/m , BMI Body mass index is 48.5 kg/m.  Wt Readings from Last 3 Encounters:  09/08/21 (!) 357 lb 9.6 oz (162.2 kg)  05/01/21 (!) 340 lb (154.2 kg)  02/18/21 (!) 346 lb (156.9 kg)    General: Obese patient appears comfortable at rest. Neck: Supple, no elevated JVP or carotid bruits, no thyromegaly. Lungs: Clear to auscultation, nonlabored breathing at rest. Cardiac: Regular rate and rhythm, no S3 or significant systolic murmur, no pericardial rub. Extremities: No pitting edema, distal pulses 2+. Skin: Warm and dry. Musculoskeletal: No kyphosis. Neuropsychiatric: Alert and oriented x3, affect grossly appropriate.  ECG: September 08, 2021 EKG normal sinus rhythm rate of 83  Recent Labwork: No results found for requested labs within last 8760 hours.     Component Value Date/Time   CHOL 114 09/06/2020 0951   TRIG 101 09/06/2020 0951   HDL 30 (L) 09/06/2020 0951   CHOLHDL 3.8 09/06/2020 0951   VLDL 20 09/06/2020 0951   LDLCALC 64 09/06/2020 0951    Other Studies Reviewed Today:  CT cardiac scoring 07/04/2020 IMPRESSION: Coronary calcium score of 24. This was 80th percentile for age and sex  matched control   Echocardiogram 06/04/2020  1. Poor quality images no definity used. 2. Left ventricular ejection fraction, by estimation, is 60 to 65%. The left ventricle has normal function. The left ventricle has no regional wall motion abnormalities. There is mild left ventricular hypertrophy. Left ventricular diastolic parameters were normal. 3. Right ventricular systolic function is normal. The right ventricular size is normal. 4. Left atrial size was mildly dilated. 5. The mitral valve is normal in structure. No evidence of mitral valve regurgitation. No evidence of mitral stenosis. 6. The aortic valve was not well visualized. Aortic valve regurgitation is not visualized. Mild  aortic valve sclerosis is present, with no evidence of aortic valve stenosis. 7. The inferior vena cava is normal in size with greater than 50% respiratory variability, suggesting right atrial pressure of 3 mmHg.   Assessment and Plan:  1. Chronic dyspnea   2. Essential hypertension   3. Mixed hyperlipidemia   4. Type 2 diabetes mellitus with complication, without long-term current use of insulin (HCC)   5. OSA on CPAP    1. Chronic dyspnea Denies any current dyspnea.  He states dyspnea has improved.  He states he was recently prescribed an inhaler which has helped his breathing.  2. Essential hypertension Blood pressure elevated today at 150/90.  Patient states his blood pressures at home are usually in the high 120s to low 130s systolic and low 10U diastolic.  Continue Hyzaar 100/25 mg p.o. daily.  Continue Toprol-XL 25 mg daily.  3. Mixed hyperlipidemia Lipid panel 09/06/2020; TC 114, TG 101, HDL 30, LDL 64.  Continue Crestor 10 mg daily.  Continue aspirin 81 mg daily.  4. Type 2 diabetes mellitus with complication, without long-term current use of insulin [facility]) Patient states he is actually prediabetic.  He continues on metformin 500 mg p.o. twice daily.  Continue to follow with PCP for  prediabetes  5.  OSA on CPAP Patient states he has had a sleep study since last visit and has been on CPAP for quite some time and it has made a disturbance in his breathing and he continues on CPAP.  Medication Adjustments/Labs and Tests Ordered: Current medicines are reviewed at length with the patient today.  Concerns regarding medicines are outlined above.   Disposition: Follow-up with Dr. Tenny Craw or APP 1 year  Signed, Rennis Harding, NP 09/08/2021 10:22 AM    [facility] at [facility] [address] [place], [place], [place] [address] Phone: [phone]; Fax: [phone]

## 2021-09-08 ENCOUNTER — Ambulatory Visit: Payer: 59 | Admitting: Family Medicine

## 2021-09-08 ENCOUNTER — Other Ambulatory Visit: Payer: Self-pay

## 2021-09-08 ENCOUNTER — Encounter: Payer: Self-pay | Admitting: Family Medicine

## 2021-09-08 VITALS — BP 150/90 | HR 83 | Ht 72.0 in | Wt 357.6 lb

## 2021-09-08 DIAGNOSIS — I10 Essential (primary) hypertension: Secondary | ICD-10-CM

## 2021-09-08 DIAGNOSIS — E782 Mixed hyperlipidemia: Secondary | ICD-10-CM | POA: Diagnosis not present

## 2021-09-08 DIAGNOSIS — R0609 Other forms of dyspnea: Secondary | ICD-10-CM | POA: Diagnosis not present

## 2021-09-08 DIAGNOSIS — E118 Type 2 diabetes mellitus with unspecified complications: Secondary | ICD-10-CM | POA: Diagnosis not present

## 2021-09-08 DIAGNOSIS — Z9989 Dependence on other enabling machines and devices: Secondary | ICD-10-CM

## 2021-09-08 DIAGNOSIS — G4733 Obstructive sleep apnea (adult) (pediatric): Secondary | ICD-10-CM

## 2021-09-08 DIAGNOSIS — R29818 Other symptoms and signs involving the nervous system: Secondary | ICD-10-CM

## 2021-09-08 MED ORDER — ROSUVASTATIN CALCIUM 10 MG PO TABS: 10.0000 mg | ORAL_TABLET | Freq: Every day | ORAL | 3 refills | Status: DC

## 2021-09-08 NOTE — Patient Instructions (Addendum)
Medication Instructions:  Continue all current medications.  Labwork: none  Testing/Procedures: none  Follow-Up: 1 year   Any Other Special Instructions Will Be Listed Below (If Applicable).  If you need a refill on your cardiac medications before your next appointment, please call your pharmacy.  

## 2021-09-17 IMAGING — CT CT CARDIAC CORONARY ARTERY CALCIUM SCORE
3 series · 14 of 20 positions shown, 15 images · non-contrast
Comparison: None.
COMPARISON: None.

Addendum:
EXAM:
OVER-READ INTERPRETATION  CT CHEST

The following report is an over-read performed by radiologist Dr.
Habeys Goitam [REDACTED] on 07/04/2020. This
over-read does not include interpretation of cardiac or coronary
anatomy or pathology. The coronary calcium score interpretation by
the cardiologist is attached.
CLINICAL DATA: Risk stratification
Coronary Calcium Score
TECHNIQUE: The patient was scanned on a Siemens Force scanner. Axial
non-contrast 3 mm slices were carried out through the heart. The
data set was analyzed on a dedicated work station and scored using
the Agatson method.

[Series 2: casc 3.0 bv41 2 bestdiast 70 % · axial · 0.43mm/px · z∈[-272,-194]mm · 4 of 44 slices shown, 5 images]
[im 9/44  vessel]
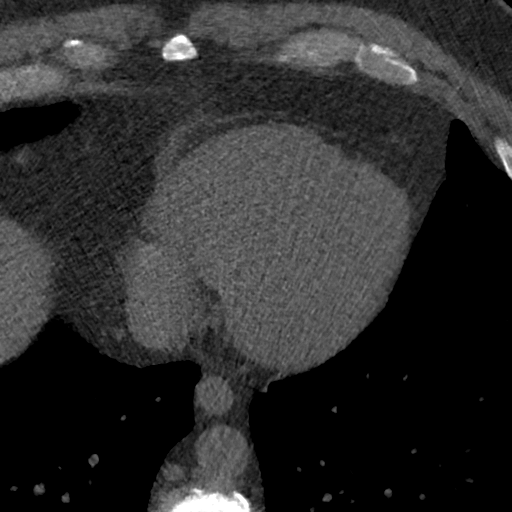
[im 9/44  lung]
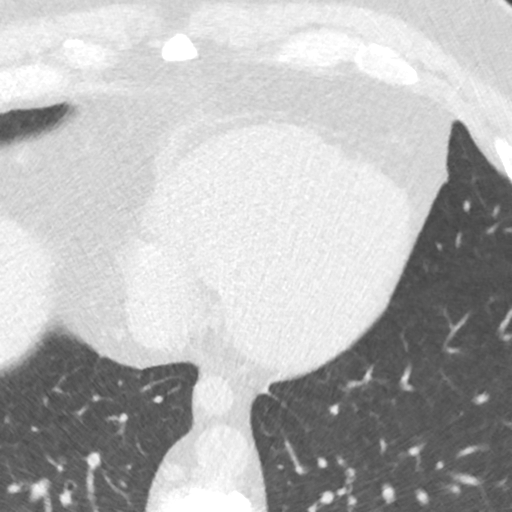
[im 18/44  vessel]
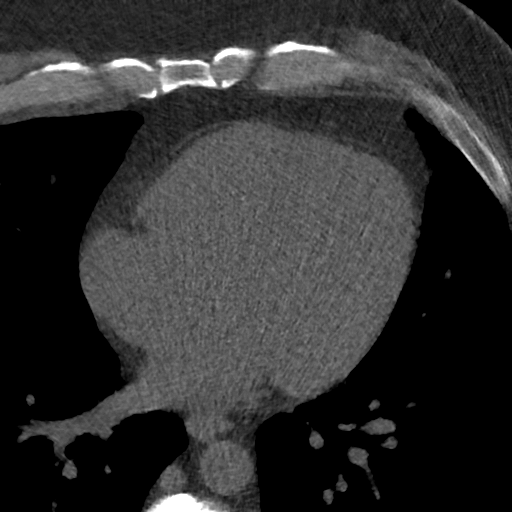
[im 26/44  vessel]
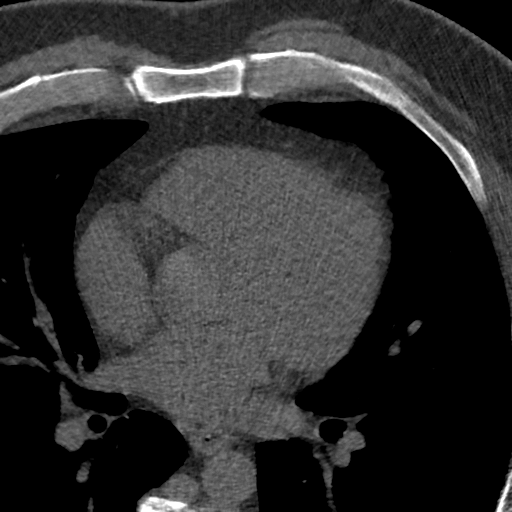
[im 35/44  vessel]
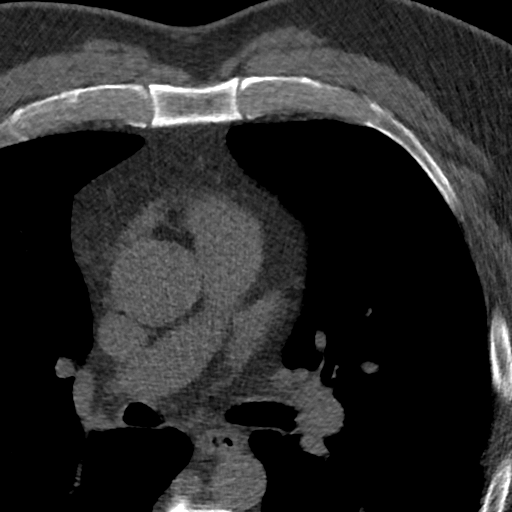

[Series 3: lung 70 % · axial · 0.77mm/px · z∈[-275,-191]mm · 5 of 44 slices shown]
[im 8/44  lung]
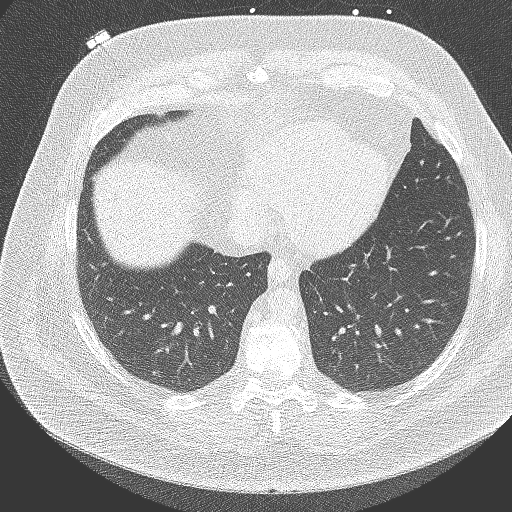
[im 15/44  lung]
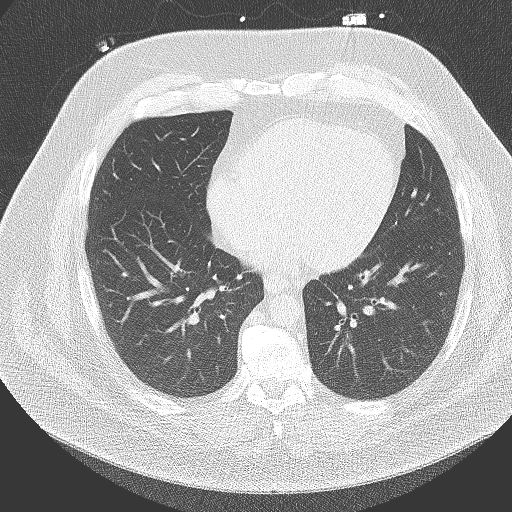
[im 22/44  lung]
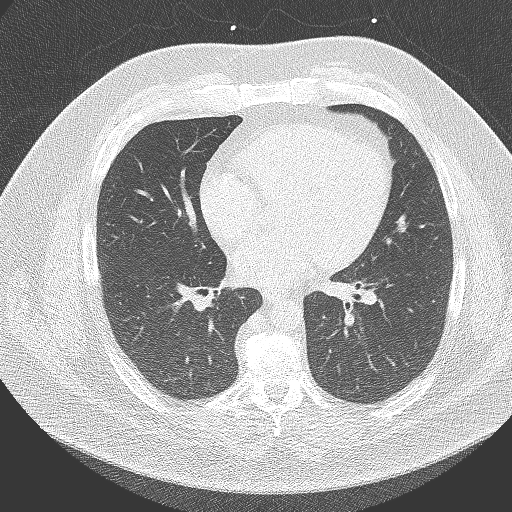
[im 29/44  lung]
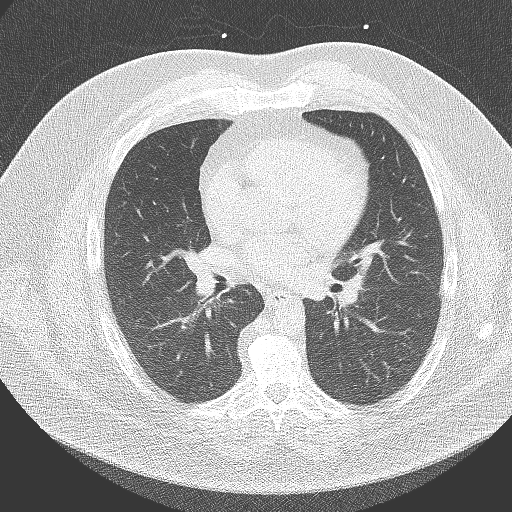
[im 36/44  lung]
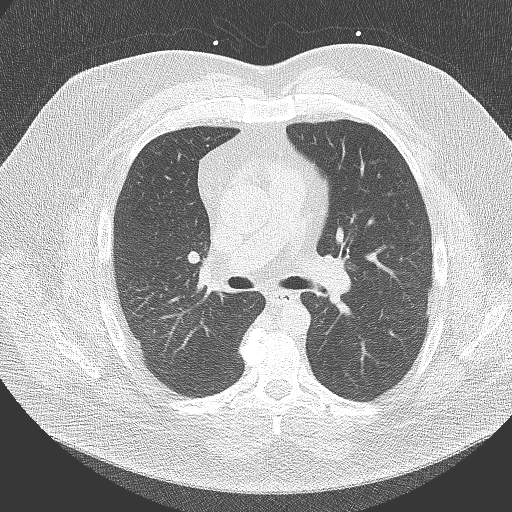

[Series 4: lung st 70 % · axial · 0.77mm/px · z∈[-275,-191]mm · 5 of 44 slices shown]
[im 8/44  lung]
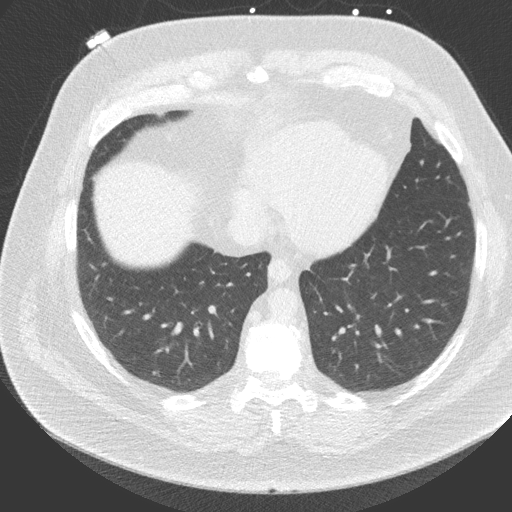
[im 15/44  lung]
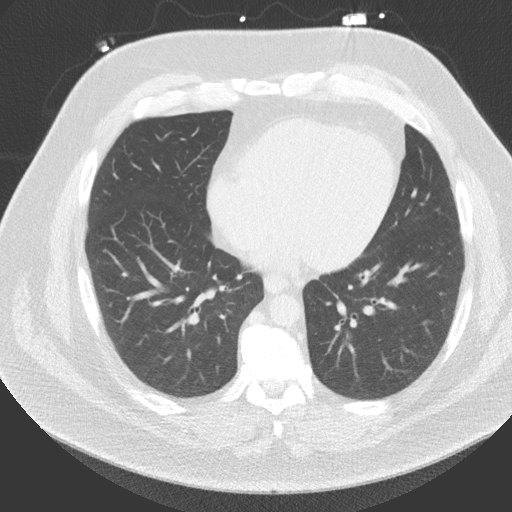
[im 22/44  lung]
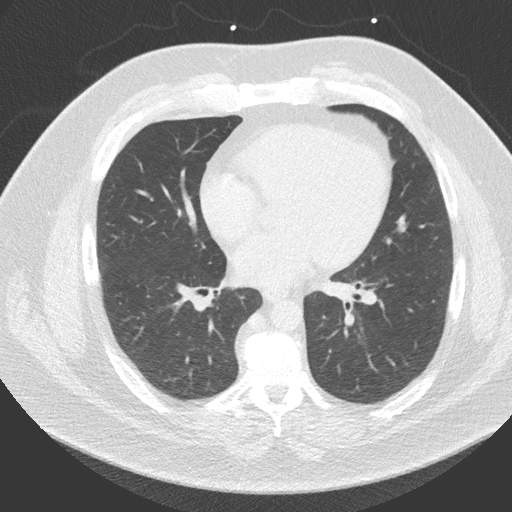
[im 29/44  lung]
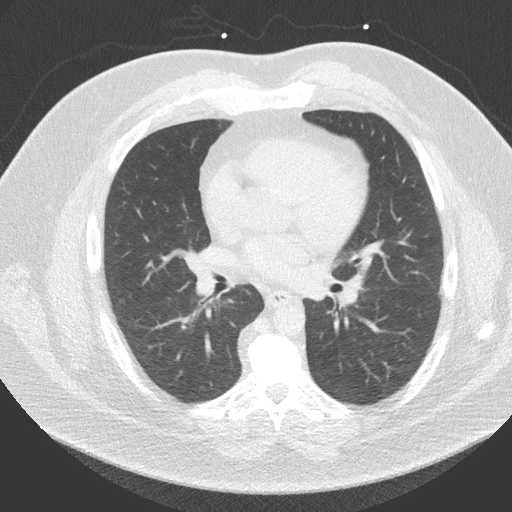
[im 36/44  lung]
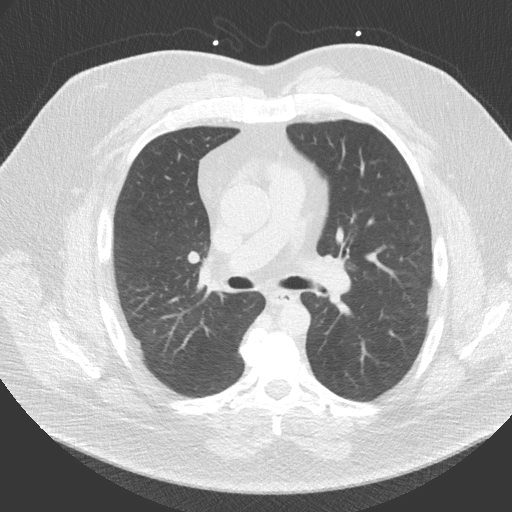

[14 of 20 positions shown; findings below may reference images not displayed]

FINDINGS: Within the visualized portions of the thorax there are no suspicious
appearing pulmonary nodules or masses, there is no acute
consolidative airspace disease, no pleural effusions, no
pneumothorax and no lymphadenopathy. Visualized portions of the
upper abdomen are unremarkable. There are no aggressive appearing
lytic or blastic lesions noted in the visualized portions of the
skeleton.
IMPRESSION: No significant incidental noncardiac findings are noted.
FINDINGS: Non-cardiac: See separate report from [REDACTED].

Ascending Aorta: Normal caliber.

Pericardium: Normal

Coronary arteries: Normal origins.  Calcium in the LAD.
IMPRESSION: Coronary calcium score of 24. This was 80th percentile for age and
sex matched control.

*** End of Addendum ***
EXAM:
OVER-READ INTERPRETATION  CT CHEST

The following report is an over-read performed by radiologist Dr.
Habeys Goitam [REDACTED] on 07/04/2020. This
over-read does not include interpretation of cardiac or coronary
anatomy or pathology. The coronary calcium score interpretation by
the cardiologist is attached.
FINDINGS: Within the visualized portions of the thorax there are no suspicious
appearing pulmonary nodules or masses, there is no acute
consolidative airspace disease, no pleural effusions, no
pneumothorax and no lymphadenopathy. Visualized portions of the
upper abdomen are unremarkable. There are no aggressive appearing
lytic or blastic lesions noted in the visualized portions of the
skeleton.
IMPRESSION: No significant incidental noncardiac findings are noted.

## 2022-04-20 ENCOUNTER — Other Ambulatory Visit: Payer: Self-pay

## 2022-04-20 ENCOUNTER — Encounter: Payer: Self-pay | Admitting: Emergency Medicine

## 2022-04-20 ENCOUNTER — Ambulatory Visit
Admission: EM | Admit: 2022-04-20 | Discharge: 2022-04-20 | Disposition: A | Discharge status: Home / Self Care | Payer: Self-pay | Attending: Family Medicine | Admitting: Family Medicine

## 2022-04-20 DIAGNOSIS — R600 Localized edema: Secondary | ICD-10-CM

## 2022-04-20 DIAGNOSIS — S81811A Laceration without foreign body, right lower leg, initial encounter: Secondary | ICD-10-CM

## 2022-04-20 DIAGNOSIS — Z23 Encounter for immunization: Secondary | ICD-10-CM

## 2022-04-20 MED ORDER — CEPHALEXIN 500 MG PO CAPS: 500.0000 mg | ORAL_CAPSULE | Freq: Two times a day (BID) | ORAL | 0 refills | Status: DC

## 2022-04-20 MED ORDER — TETANUS-DIPHTH-ACELL PERTUSSIS 5-2.5-18.5 LF-MCG/0.5 IM SUSY
0.5000 mL | PREFILLED_SYRINGE | Freq: Once | INTRAMUSCULAR | Status: AC
  Administered 2022-04-20: 0.5 mL via INTRAMUSCULAR

## 2022-04-20 MED ORDER — MUPIROCIN 2 % EX OINT: 1.0000 "application " | TOPICAL_OINTMENT | Freq: Two times a day (BID) | CUTANEOUS | 0 refills | Status: DC

## 2022-04-20 MED ORDER — CHLORHEXIDINE GLUCONATE 4 % EX LIQD: Freq: Every day | CUTANEOUS | 0 refills | Status: DC | PRN

## 2022-04-20 NOTE — ED Triage Notes (Signed)
Pt reports laceration to right shin this am from a piece of steel. Pt reports site bled initially but bleeding is controlled now and happened approximately 830 this am. Pt noted to have large irregular laceration. Last tetanus approximately 5 years ago. ?

## 2022-04-20 NOTE — ED Notes (Addendum)
Site cleaned with Hibiclens and sterile water. Pt tolerated well. ? ?Site management and infection prevention education provided. Pt and pt family verbalized understanding. ? ?Nonadherent pad applied to site and secured with coban. ?

## 2022-04-24 NOTE — ED Provider Notes (Signed)
?[facility] ? ? ? ?CSN: [id number] ?Arrival date & time: 04/20/22  1837 ? ? ?  ? ?History   ?Chief Complaint ?Chief Complaint  ?Patient presents with  ? Laceration  ? ? ?HPI ?Brandon Monroe is a 51 y.o. male.  ? ?Presenting today for evaluation of laceration to right shin that occurred about 12 hours ago when he hit his leg against a metal trailer piece. He states he has been keeping it clean, applying pressure with good control of bleeding. He denies leg numbness, weakness, diffuse discoloration. Unsure when his last tdap was.  ? ? ?Past Medical History:  ?Diagnosis Date  ? Acid reflux   ? Esophagitis   ? eosinic   ? Hypertension   ? Seasonal allergies   ? ? ?Patient Active Problem List  ? Diagnosis Date Noted  ? Pilonidal cyst   ? 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 FINGER SUP FB W/O MAJ OPEN WOUND&W/O MENTION INF 06/29/2008  ? ? ?Past Surgical History:  ?Procedure Laterality Date  ? ESOPHAGOGASTRODUODENOSCOPY    ? PILONIDAL CYST EXCISION N/A 06/26/2020  ? Procedure: CYST EXCISION PILONIDAL EXTENSIVE;  Surgeon: [name], MD;  Location: [facility];  Service: General;  Laterality: N/A;  ? 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 WISDOM TOOTH EXTRACTION    ? ? ? ? ? ?Home Medications   ? ?Prior to Admission medications   ?Medication Sig Start Date End Date Taking? Authorizing Provider  ?cephALEXin (KEFLEX) 500 MG capsule Take 1 capsule (500 mg total) by mouth 2 (two) times daily. 04/20/22  Yes Particia Nearing, PA-C  ?chlorhexidine (HIBICLENS) 4 % external liquid Apply topically daily as needed. 04/20/22  Yes Particia Nearing, PA-C  ?mupirocin ointment (BACTROBAN) 2 % Apply 1 application. topically 2 (two) times daily. 04/20/22  Yes Particia Nearing, PA-C  ?albuterol (VENTOLIN HFA) 108 (90 Base) MCG/ACT inhaler SMARTSIG:1-2 Puff(s) By Mouth Every 4 Hours PRN 08/15/21   [provider]  ?aspirin EC 81 MG tablet Take 81 mg by mouth daily.    [provider]  ?diphenhydrAMINE  (BENADRYL) 25 MG tablet Take 50 mg by mouth at bedtime as needed (sleep.).     [provider]  ?ibuprofen (ADVIL,MOTRIN) 200 MG tablet Take 400 mg by mouth every 6 (six) hours as needed for moderate pain.    [provider]  ?lansoprazole (PREVACID) 15 MG capsule Take 15 mg by mouth daily before breakfast.    [provider]  ?loratadine (CLARITIN) 10 MG tablet Take 10 mg by mouth daily.    [provider]  ?losartan-hydrochlorothiazide (HYZAAR) 100-25 MG tablet Take 1 tablet by mouth daily. 05/21/20   [provider]  ?metFORMIN (GLUCOPHAGE) 500 MG tablet Take 500 mg by mouth 2 (two) times daily. 05/15/20   [provider]  ?metoprolol succinate (TOPROL-XL) 25 MG 24 hr tablet Take 25 mg by mouth daily.     [provider]  ?Multiple Vitamin (MULTIVITAMIN WITH MINERALS) TABS tablet Take 1 tablet by mouth daily.    [provider]  ?Omega-3 Fatty Acids (FISH OIL PO) Take 4,000 mg by mouth daily.     [provider]  ?rosuvastatin (CRESTOR) 10 MG tablet Take 1 tablet (10 mg total) by mouth daily. 09/08/21   Netta Neat., NP  ?tadalafil (CIALIS) 5 MG tablet SMARTSIG:1-4 Tablet(s) By Mouth PRN 06/11/21   [provider]  ?triamcinolone cream (KENALOG) 0.1 % Apply 1 application topically in the  morning and at bedtime. 03/05/20   [provider]  ? ? ?Family History ?History reviewed. No pertinent family history. ? ?Social History ?Social History  ? ?Tobacco Use  ? Smoking status: Former  ? Smokeless tobacco: Never  ?Substance Use Topics  ? Alcohol use: Yes  ?  Comment: occ  ? Drug use: No  ? ? ? ?Allergies   ?Peanut-containing drug products ? ? ?Review of Systems ?Review of Systems ?PER HPI ? ?Physical Exam ?Triage Vital Signs ?ED Triage Vitals  ?Enc Vitals Group  ?   BP 04/20/22 1951 (!) 162/84  ?   Pulse Rate 04/20/22 1951 83  ?   Resp 04/20/22 1951 18  ?   Temp 04/20/22 1951 98.3 ?F (36.8 ?C)  ?   Temp Source 04/20/22  1951 Oral  ?   SpO2 04/20/22 1951 96 %  ?   Weight 04/20/22 1952 (!) 340 lb (154.2 kg)  ?   Height 04/20/22 1952 6' (1.829 m)  ?   Head Circumference --   ?   Peak Flow --   ?   Pain Score 04/20/22 1952 1  ?   Pain Loc --   ?   Pain Edu? --   ?   Excl. in GC? --   ? ?No data found. ? ?Updated Vital Signs ?BP (!) 162/84 (BP Location: Right Arm)   Pulse 83   Temp 98.3 ?F (36.8 ?C) (Oral)   Resp 18   Ht 6' (1.829 m)   Wt (!) 340 lb (154.2 kg)   SpO2 96%   BMI 46.11 kg/m?  ? ?Visual Acuity ?Right Eye Distance:   ?Left Eye Distance:   ?Bilateral Distance:   ? ?Right Eye Near:   ?Left Eye Near:    ?Bilateral Near:    ? ?Physical Exam ?Vitals and nursing note reviewed.  ?Constitutional:   ?   Appearance: Normal appearance.  ?HENT:  ?   Head: Atraumatic.  ?Eyes:  ?   Extraocular Movements: Extraocular movements intact.  ?   Conjunctiva/sclera: Conjunctivae normal.  ?Cardiovascular:  ?   Rate and Rhythm: Normal rate and regular rhythm.  ?Pulmonary:  ?   Effort: Pulmonary effort is normal.  ?   Breath sounds: Normal breath sounds.  ?Musculoskeletal:     ?   General: Swelling, tenderness and signs of injury present. Normal range of motion.  ?   Cervical back: Normal range of motion and neck supple.  ?   Comments: Diffuse 1+ edema b/l LEs. Ttp in area of laceration right shin  ?Skin: ?   General: Skin is warm.  ?   Comments: Irregular poorly approximated laceration to right central shin, bleeding well controlled, no foreign body on exam  ?Neurological:  ?   General: No focal deficit present.  ?   Mental Status: He is oriented to person, place, and time.  ?   Comments: Right LE neurovascularly intact  ?Psychiatric:     ?   Mood and Affect: Mood normal.     ?   Thought Content: Thought content normal.     ?   Judgment: Judgment normal.  ? ? ? ?UC Treatments / Results  ?Labs ?(all labs ordered are listed, but only abnormal results are displayed) ?Labs Reviewed - No data to display ? ?EKG ? ? ?Radiology ?No results  found. ? ?Procedures ?Procedures (including critical care time) ? ?Medications Ordered in UC ?Medications  ?Tdap (BOOSTRIX) injection 0.5 mL (0.5 mLs Intramuscular Given 04/20/22  2010)  ? ? ?Initial Impression / Assessment and Plan / UC Course  ?I have reviewed the triage vital signs and the nursing notes. ? ?Pertinent labs & imaging results that were available during my care of the patient were reviewed by me and considered in my medical decision making (see chart for details). ? ?  ? ?Tdap updated, start abx, hibiclens, mupirocin and good home wound care reviewed. Discussed return precautions for worsening sxs. Wound closure not performed today with shared decision making given poor ability to approximate wound, extent of chronic leg edema  ?Final Clinical Impressions(s) / UC Diagnoses  ? ?Final diagnoses:  ?Laceration of right lower extremity, initial encounter  ?Need for Tdap vaccination  ?Leg edema  ? ?Discharge Instructions   ?None ?  ? ?ED Prescriptions   ? ? Medication Sig Dispense Auth. Provider  ? chlorhexidine (HIBICLENS) 4 % external liquid Apply topically daily as needed. 120 mL Particia Nearing, PA-C  ? mupirocin ointment (BACTROBAN) 2 % Apply 1 application. topically 2 (two) times daily. 22 g Particia Nearing, [place]  ? cephALEXin (KEFLEX) 500 MG capsule Take 1 capsule (500 mg total) by mouth 2 (two) times daily. 14 capsule Particia Nearing, [place]  ? ?  ? ?PDMP not reviewed this encounter. ?  ?Particia Nearing, PA-C ?04/24/22 2247 ? ?

## 2022-04-26 ENCOUNTER — Ambulatory Visit: Payer: Self-pay

## 2022-09-08 ENCOUNTER — Telehealth: Payer: Self-pay | Admitting: Internal Medicine

## 2022-09-08 NOTE — Telephone Encounter (Signed)
Pt left a message asking if we had sent his records to [place].  That was all he said on the message left.  Asked that we call him back.  [phone] or [phone]

## 2022-09-09 ENCOUNTER — Ambulatory Visit
Admission: EM | Admit: 2022-09-09 | Discharge: 2022-09-09 | Disposition: A | Discharge status: Home / Self Care | Payer: 59 | Attending: Nurse Practitioner | Admitting: Nurse Practitioner

## 2022-09-09 DIAGNOSIS — R21 Rash and other nonspecific skin eruption: Secondary | ICD-10-CM

## 2022-09-09 MED ORDER — VALACYCLOVIR HCL 1 G PO TABS: 1000.0000 mg | ORAL_TABLET | Freq: Three times a day (TID) | ORAL | 0 refills | Status: DC

## 2022-09-09 NOTE — ED Triage Notes (Signed)
Pt reports itching, burning, painful  rash in neck since this morning, Pt think is shingles.

## 2022-09-09 NOTE — Discharge Instructions (Signed)
Take medication as prescribed. May also take over-the-counter Zyrtec or Benadryl to help with itching. Avoid hot baths or showers while symptoms persist.  Recommend taking lukewarm baths. If the area becomes blistering and starts to ooze, keep the area dry covered to prevent further spread. May apply cool cloths to the area to help with itching or discomfort. Avoid scratching, rubbing, or manipulating the areas while symptoms persist. Recommend Aveeno colloidal oatmeal bath to use to help with drying and itching. Follow-up with your primary care physician if symptoms fail to improve.

## 2022-09-09 NOTE — ED Provider Notes (Signed)
[facility]    CSN: [id number] Arrival date & time: 09/09/22  1718      History   Chief Complaint Chief Complaint  Patient presents with   Rash    HPI Brandon Monroe is a 51 y.o. male.   The history is provided by the patient.   Presents with a rash to his left neck that is itching, burning, and painful.  Patient states symptoms started this morning.  Patient states that he has a history of recurring shingles.  He states when he does have his symptoms, they are not always in this area on his left neck.  He states his doctor has also talked to him about getting a shingles vaccine but he has to go 1 year without any eruption.  Patient states his last eruption was approximately 1 year ago.  Patient states that he had an old prescription of Valtrex, states that he took 1 tablet this morning.  He denies fever, chills, chest pain, shortness of breath, trouble swallowing, difficulty breathing, or other concerns.  Past Medical History:  Diagnosis Date   Acid reflux    Esophagitis    eosinic    Hypertension    Seasonal allergies     Patient Active Problem List   Diagnosis Date Noted   Pilonidal cyst    EOSINOPHILIC ESOPHAGITIS 03/50/0938   FINGER SUP FB W/O MAJ OPEN WOUND&W/O MENTION INF 06/29/2008    Past Surgical History:  Procedure Laterality Date   ESOPHAGOGASTRODUODENOSCOPY     PILONIDAL CYST EXCISION N/A 06/26/2020   Procedure: CYST EXCISION PILONIDAL EXTENSIVE;  Surgeon: Aviva Signs, MD;  Location: [facility];  Service: General;  Laterality: N/A;   TUMOR REMOVAL     RT testicle   WISDOM TOOTH EXTRACTION         Home Medications    Prior to Admission medications   Medication Sig Start Date End Date Taking? Authorizing Provider  valACYclovir (VALTREX) 1000 MG tablet Take 1 tablet (1,000 mg total) by mouth 3 (three) times daily. 09/09/22  Yes Tiran Sauseda-Warren, Alda Lea, NP  albuterol (VENTOLIN HFA) 108 (90 Base) MCG/ACT inhaler SMARTSIG:1-2 Puff(s) By Mouth  Every 4 Hours PRN 08/15/21   [provider]  aspirin EC 81 MG tablet Take 81 mg by mouth daily.    [provider]  cephALEXin (KEFLEX) 500 MG capsule Take 1 capsule (500 mg total) by mouth 2 (two) times daily. 04/20/22   Volney American, PA-C  chlorhexidine (HIBICLENS) 4 % external liquid Apply topically daily as needed. 04/20/22   Volney American, PA-C  diphenhydrAMINE (BENADRYL) 25 MG tablet Take 50 mg by mouth at bedtime as needed (sleep.).     [provider]  ibuprofen (ADVIL,MOTRIN) 200 MG tablet Take 400 mg by mouth every 6 (six) hours as needed for moderate pain.    [provider]  lansoprazole (PREVACID) 15 MG capsule Take 15 mg by mouth daily before breakfast.    [provider]  loratadine (CLARITIN) 10 MG tablet Take 10 mg by mouth daily.    [provider]  losartan-hydrochlorothiazide (HYZAAR) 100-25 MG tablet Take 1 tablet by mouth daily. 05/21/20   [provider]  metFORMIN (GLUCOPHAGE) 500 MG tablet Take 500 mg by mouth 2 (two) times daily. 05/15/20   [provider]  metoprolol succinate (TOPROL-XL) 25 MG 24 hr tablet Take 25 mg by mouth daily.     [provider]  Multiple Vitamin (MULTIVITAMIN WITH MINERALS) TABS tablet Take 1  tablet by mouth daily.    [provider]  mupirocin ointment (BACTROBAN) 2 % Apply 1 application. topically 2 (two) times daily. 04/20/22   Particia Nearing, PA-C  Omega-3 Fatty Acids (FISH OIL PO) Take 4,000 mg by mouth daily.     [provider]  rosuvastatin (CRESTOR) 10 MG tablet Take 1 tablet (10 mg total) by mouth daily. 09/08/21   Netta Neat., NP  tadalafil (CIALIS) 5 MG tablet SMARTSIG:1-4 Tablet(s) By Mouth PRN 06/11/21   [provider]  triamcinolone cream (KENALOG) 0.1 % Apply 1 application topically in the morning and at bedtime. 03/05/20   [provider]    Family History History reviewed. No pertinent  family history.  Social History Social History   Tobacco Use   Smoking status: Former   Smokeless tobacco: Never  Substance Use Topics   Alcohol use: Yes    Comment: occ   Drug use: No     Allergies   Peanut-containing drug products   Review of Systems Review of Systems Per HPI  Physical Exam Triage Vital Signs ED Triage Vitals  Enc Vitals Group     BP 09/09/22 1741 (!) 174/100     Pulse Rate 09/09/22 1741 79     Resp 09/09/22 1741 18     Temp 09/09/22 1741 98.8 F (37.1 C)     Temp Source 09/09/22 1741 Oral     SpO2 09/09/22 1741 95 %     Weight --      Height --      Head Circumference --      Peak Flow --      Pain Score 09/09/22 1740 4     Pain Loc --      Pain Edu? --      Excl. in GC? --    No data found.  Updated Vital Signs BP (!) 174/100 (BP Location: Right Wrist)   Pulse 79   Temp 98.8 F (37.1 C) (Oral)   Resp 18   SpO2 95%   Visual Acuity Right Eye Distance:   Left Eye Distance:   Bilateral Distance:    Right Eye Near:   Left Eye Near:    Bilateral Near:     Physical Exam Vitals and nursing note reviewed.  Constitutional:      Appearance: Normal appearance.  HENT:     Head: Normocephalic.     Mouth/Throat:     Mouth: Mucous membranes are moist.  Eyes:     Extraocular Movements: Extraocular movements intact.     Pupils: Pupils are equal, round, and reactive to light.  Cardiovascular:     Rate and Rhythm: Regular rhythm.     Heart sounds: Normal heart sounds.  Pulmonary:     Effort: Pulmonary effort is normal. No respiratory distress.     Breath sounds: Normal breath sounds. No stridor. No wheezing, rhonchi or rales.  Abdominal:     General: Bowel sounds are normal.     Palpations: Abdomen is soft.  Musculoskeletal:     Cervical back: Normal range of motion.  Lymphadenopathy:     Cervical: No cervical adenopathy.  Skin:    General: Skin is warm and dry.     Findings: Rash present.     Comments: Linear pustular rash  noted to the left neck.  Neurological:     General: No focal deficit present.     Mental Status: He is alert and oriented to person, place, and time.  Psychiatric:        Mood and Affect: Mood normal.        Behavior: Behavior normal.      UC Treatments / Results  Labs (all labs ordered are listed, but only abnormal results are displayed) Labs Reviewed - No data to display  EKG   Radiology No results found.  Procedures Procedures (including critical care time)  Medications Ordered in UC Medications - No data to display  Initial Impression / Assessment and Plan / UC Course  I have reviewed the triage vital signs and the nursing notes.  Pertinent labs & imaging results that were available during my care of the patient were reviewed by me and considered in my medical decision making (see chart for details).  Patient presents for a shingles rash that started today.  On exam, the patient has a pustular rash noted to the left neck.  There is no oozing, fluctuance, or blistering, or drainage present at this time.  Patient was prescribed Valtrex for his symptoms.  Supportive care recommendations were provided to the patient.  Patient was advised to follow-up with his primary care to discuss administration of the shingles vaccine.  Patient verbalizes understanding.  All questions were answered.  Final Clinical Impressions(s) / UC Diagnoses   Final diagnoses:  Rash and nonspecific skin eruption     Discharge Instructions      Take medication as prescribed. May also take over-the-counter Zyrtec or Benadryl to help with itching. Avoid hot baths or showers while symptoms persist.  Recommend taking lukewarm baths. If the area becomes blistering and starts to ooze, keep the area dry covered to prevent further spread. May apply cool cloths to the area to help with itching or discomfort. Avoid scratching, rubbing, or manipulating the areas while symptoms persist. Recommend Aveeno  colloidal oatmeal bath to use to help with drying and itching. Follow-up with your primary care physician if symptoms fail to improve.     ED Prescriptions     Medication Sig Dispense Auth. Provider   valACYclovir (VALTREX) 1000 MG tablet Take 1 tablet (1,000 mg total) by mouth 3 (three) times daily. 21 tablet Brena Windsor-Warren, Alda Lea, NP      PDMP not reviewed this encounter.   Tish Men, NP 09/09/22 1818

## 2022-09-16 NOTE — Telephone Encounter (Signed)
We've never seen him before in the office. No records to send.

## 2022-10-05 ENCOUNTER — Other Ambulatory Visit: Payer: Self-pay | Admitting: *Deleted

## 2022-10-05 MED ORDER — ROSUVASTATIN CALCIUM 10 MG PO TABS: 10.0000 mg | ORAL_TABLET | Freq: Every day | ORAL | 0 refills | Status: DC

## 2022-10-15 DIAGNOSIS — Z1211 Encounter for screening for malignant neoplasm of colon: Secondary | ICD-10-CM | POA: Diagnosis not present

## 2022-10-15 DIAGNOSIS — K200 Eosinophilic esophagitis: Secondary | ICD-10-CM | POA: Diagnosis not present

## 2022-10-15 DIAGNOSIS — R1319 Other dysphagia: Secondary | ICD-10-CM | POA: Diagnosis not present

## 2022-11-08 ENCOUNTER — Ambulatory Visit: Payer: Self-pay

## 2022-11-08 ENCOUNTER — Ambulatory Visit
Admission: EM | Admit: 2022-11-08 | Discharge: 2022-11-08 | Disposition: A | Discharge status: Home / Self Care | Payer: 59 | Attending: Family Medicine | Admitting: Family Medicine

## 2022-11-08 DIAGNOSIS — J069 Acute upper respiratory infection, unspecified: Secondary | ICD-10-CM

## 2022-11-08 DIAGNOSIS — Z1152 Encounter for screening for COVID-19: Secondary | ICD-10-CM | POA: Diagnosis not present

## 2022-11-08 LAB — RESP PANEL BY RT-PCR (FLU A&B, COVID) ARPGX2
Influenza A by PCR: NEGATIVE
Influenza B by PCR: NEGATIVE
SARS Coronavirus 2 by RT PCR: NEGATIVE

## 2022-11-08 MED ORDER — PROMETHAZINE-DM 6.25-15 MG/5ML PO SYRP: 5.0000 mL | ORAL_SOLUTION | Freq: Four times a day (QID) | ORAL | 0 refills | Status: DC | PRN

## 2022-11-08 NOTE — ED Provider Notes (Signed)
[facility]    CSN: [id number] Arrival date & time: 11/08/22  1244      History   Chief Complaint No chief complaint on file.   HPI Brandon Monroe is a 51 y.o. male.   Patient presenting today with 5-day history of sore throat, cough, sneezing, loss of smell, fatigue, body aches.  Denies known fever, chest pain, shortness of breath, wheezing, abdominal pain, nausea vomiting or diarrhea.  So far trying Mucinex day and night and allergy medication with no relief.  Home COVID test was negative.  No known chronic pulmonary disease.     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 Authorizing Provider  promethazine-dextromethorphan (PROMETHAZINE-DM) 6.25-15 MG/5ML syrup Take 5 mLs by mouth 4 (four) times daily as needed. 11/08/22  Yes Particia Nearing, PA-C  albuterol (VENTOLIN HFA) 108 (90 Base) MCG/ACT inhaler SMARTSIG:1-2 Puff(s) By Mouth Every 4 Hours PRN 08/15/21   [provider]  aspirin EC 81 MG tablet Take 81 mg by mouth daily.    [provider]  cephALEXin (KEFLEX) 500 MG capsule Take 1 capsule (500 mg total) by mouth 2 (two) times daily. 04/20/22   Particia Nearing, PA-C  chlorhexidine (HIBICLENS) 4 % external liquid Apply topically daily as needed. 04/20/22    Particia Nearing, PA-C  diphenhydrAMINE (BENADRYL) 25 MG tablet Take 50 mg by mouth at bedtime as needed (sleep.).     [provider]  ibuprofen (ADVIL,MOTRIN) 200 MG tablet Take 400 mg by mouth every 6 (six) hours as needed for moderate pain.    [provider]  lansoprazole (PREVACID) 15 MG capsule Take 15 mg by mouth daily before breakfast.    [provider]  loratadine (CLARITIN) 10 MG tablet Take 10 mg by mouth daily.    [provider]  losartan-hydrochlorothiazide (HYZAAR) 100-25 MG tablet Take 1 tablet by mouth daily. 05/21/20   [provider]  metFORMIN (GLUCOPHAGE) 500 MG tablet Take 500 mg by mouth 2 (two) times daily. 05/15/20   [provider]  metoprolol succinate (TOPROL-XL) 25 MG 24 hr tablet Take 25 mg by mouth daily.     [provider]  Multiple Vitamin (MULTIVITAMIN WITH MINERALS) TABS tablet Take 1 tablet by mouth daily.    [provider]  mupirocin ointment (BACTROBAN) 2 % Apply 1 application. topically 2 (two) times daily. 04/20/22   Particia Nearing, PA-C  Omega-3 Fatty Acids (FISH OIL PO) Take 4,000 mg by mouth daily.     [provider]  rosuvastatin (CRESTOR) 10 MG tablet Take 1 tablet (10 mg total) by mouth daily. 10/05/22   Pricilla Riffle, MD  tadalafil (CIALIS) 5  MG tablet SMARTSIG:1-4 Tablet(s) By Mouth PRN 06/11/21   [provider]  triamcinolone cream (KENALOG) 0.1 % Apply 1 application topically in the morning and at bedtime. 03/05/20   [provider]  valACYclovir (VALTREX) 1000 MG tablet Take 1 tablet (1,000 mg total) by mouth 3 (three) times daily. 09/09/22   Leath-Warren, Sadie Haber, NP    Family History History reviewed. No pertinent family history.  Social History Social History   Tobacco Use   Smoking status: Former   Smokeless tobacco: Never  Substance Use Topics   Alcohol use: Yes    Comment: occ   Drug use: No     Allergies    Peanut-containing drug products   Review of Systems Review of Systems Per HPI  Physical Exam Triage Vital Signs ED Triage Vitals  Enc Vitals Group     BP 11/08/22 1335 (!) 182/80     Pulse Rate 11/08/22 1335 81     Resp 11/08/22 1335 20     Temp 11/08/22 1335 98.4 F (36.9 C)     Temp Source 11/08/22 1335 Oral     SpO2 11/08/22 1335 97 %     Weight --      Height --      Head Circumference --      Peak Flow --      Pain Score 11/08/22 1340 0     Pain Loc --      Pain Edu? --      Excl. in GC? --    No data found.  Updated Vital Signs BP (!) 182/80   Pulse 81   Temp 98.4 F (36.9 C) (Oral)   Resp 20   SpO2 97%   Visual Acuity Right Eye Distance:   Left Eye Distance:   Bilateral Distance:    Right Eye Near:   Left Eye Near:    Bilateral Near:     Physical Exam Vitals and nursing note reviewed.  Constitutional:      Appearance: He is well-developed.  HENT:     Head: Atraumatic.     Right Ear: External ear normal.     Left Ear: External ear normal.     Nose: Rhinorrhea present.     Mouth/Throat:     Pharynx: Posterior oropharyngeal erythema present. No oropharyngeal exudate.  Eyes:     Conjunctiva/sclera: Conjunctivae normal.     Pupils: Pupils are equal, round, and reactive to light.  Cardiovascular:     Rate and Rhythm: Normal rate and regular rhythm.     Heart sounds: Normal heart sounds.  Pulmonary:     Effort: Pulmonary effort is normal. No respiratory distress.     Breath sounds: No wheezing or rales.  Musculoskeletal:        General: Normal range of motion.     Cervical back: Normal range of motion and neck supple.  Lymphadenopathy:     Cervical: No cervical adenopathy.  Skin:    General: Skin is warm and dry.  Neurological:     Mental Status: He is alert and oriented to person, place, and time.  Psychiatric:        Behavior: Behavior normal.      UC Treatments / Results  Labs (all labs ordered are listed, but only abnormal  results are displayed) Labs Reviewed  RESP PANEL BY RT-PCR (FLU A&B, COVID) [id number]    EKG   Radiology No results found.  Procedures Procedures (including critical care time)  Medications Ordered in  UC Medications - No data to display  Initial Impression / Assessment and Plan / UC Course  I have reviewed the triage vital signs and the nursing notes.  Pertinent labs & imaging results that were available during my care of the patient were reviewed by me and considered in my medical decision making (see chart for details).     Hypertensive in triage, otherwise vital signs and exam reassuring and suggestive of a viral upper respiratory infection.  Respiratory panel pending, treat with Phenergan DM, supportive over-the-counter medications and home care.  Return for worsening symptoms.  Final Clinical Impressions(s) / UC Diagnoses   Final diagnoses:  Viral URI with cough   Discharge Instructions   None    ED Prescriptions     Medication Sig Dispense Auth. Provider   promethazine-dextromethorphan (PROMETHAZINE-DM) 6.25-15 MG/5ML syrup Take 5 mLs by mouth 4 (four) times daily as needed. 100 mL Particia Nearing, [place]      PDMP not reviewed this encounter.   Particia Nearing, [place] 11/08/22 1456

## 2022-11-08 NOTE — ED Triage Notes (Signed)
Pt reports since Wednesday he has had a sore throat, coughing sneezing, lost sense of smell. Took musinex day and night and [facility] sinus ands allergy but no relief.

## 2022-11-11 ENCOUNTER — Other Ambulatory Visit: Payer: Self-pay | Admitting: Internal Medicine

## 2022-11-23 DIAGNOSIS — K2289 Other specified disease of esophagus: Secondary | ICD-10-CM | POA: Diagnosis not present

## 2022-11-23 DIAGNOSIS — K635 Polyp of colon: Secondary | ICD-10-CM | POA: Diagnosis not present

## 2022-11-23 DIAGNOSIS — K200 Eosinophilic esophagitis: Secondary | ICD-10-CM | POA: Diagnosis not present

## 2022-11-23 DIAGNOSIS — K317 Polyp of stomach and duodenum: Secondary | ICD-10-CM | POA: Diagnosis not present

## 2022-11-23 DIAGNOSIS — K648 Other hemorrhoids: Secondary | ICD-10-CM | POA: Diagnosis not present

## 2022-11-23 DIAGNOSIS — Z1211 Encounter for screening for malignant neoplasm of colon: Secondary | ICD-10-CM | POA: Diagnosis not present

## 2022-11-23 DIAGNOSIS — R1310 Dysphagia, unspecified: Secondary | ICD-10-CM | POA: Diagnosis not present

## 2022-11-24 ENCOUNTER — Other Ambulatory Visit: Payer: Self-pay

## 2022-11-24 ENCOUNTER — Ambulatory Visit
Admission: EM | Admit: 2022-11-24 | Discharge: 2022-11-24 | Disposition: A | Discharge status: Home / Self Care | Payer: 59 | Attending: Nurse Practitioner | Admitting: Nurse Practitioner

## 2022-11-24 ENCOUNTER — Encounter: Payer: Self-pay | Admitting: Emergency Medicine

## 2022-11-24 DIAGNOSIS — R051 Acute cough: Secondary | ICD-10-CM | POA: Diagnosis not present

## 2022-11-24 DIAGNOSIS — R6889 Other general symptoms and signs: Secondary | ICD-10-CM | POA: Insufficient documentation

## 2022-11-24 DIAGNOSIS — Z1152 Encounter for screening for COVID-19: Secondary | ICD-10-CM | POA: Diagnosis not present

## 2022-11-24 LAB — RESP PANEL BY RT-PCR (FLU A&B, COVID) ARPGX2
Influenza A by PCR: POSITIVE — AB
Influenza B by PCR: NEGATIVE
SARS Coronavirus 2 by RT PCR: NEGATIVE

## 2022-11-24 LAB — POCT RAPID STREP A (OFFICE): Rapid Strep A Screen: NEGATIVE

## 2022-11-24 NOTE — ED Provider Notes (Signed)
[facility]    CSN: [id number] Arrival date & time: 11/24/22  1816      History   Chief Complaint Chief Complaint  Patient presents with   Cough    HPI Brandon Monroe is a 51 y.o. male.   The history is provided by the patient.   The patient presents for complaints of cough, generalized body aches, chills, and sore throat that started over the past 24 hours.  Patient reports that he had a colonoscopy prior to his symptoms starting, and then went and sat in the emergency department with his parents.  He states that he also has a cough, that is productive of clear sputum.  Patient denies headache, ear pain, abdominal pain, nausea, vomiting, or diarrhea.  Patient states his last dose of Tylenol/ibuprofen was around 1 PM today.  He states that he has been vaccinated for both influenza and COVID.  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 Authorizing Provider  triamcinolone (NASACORT ALLERGY 24HR) 55 MCG/ACT AERO nasal inhaler Place 2 sprays into the nose daily.   Yes [provider]  albuterol (VENTOLIN HFA) 108 (90 Base) MCG/ACT inhaler SMARTSIG:1-2 Puff(s) By Mouth Every 4 Hours PRN 08/15/21   [provider]  aspirin EC 81 MG tablet Take 81 mg by mouth daily.    [provider]  cephALEXin (KEFLEX) 500 MG capsule Take 1 capsule (500 mg total) by mouth 2 (two) times daily. 04/20/22   Particia Nearing, PA-C  chlorhexidine (HIBICLENS) 4 % external liquid Apply topically daily as needed. 04/20/22   Particia Nearing, PA-C  diphenhydrAMINE (BENADRYL) 25 MG tablet Take 50 mg by mouth at bedtime as needed (sleep.).     [provider]  ibuprofen (ADVIL,MOTRIN) 200 MG tablet Take 400 mg by mouth every 6 (six) hours as needed for moderate pain.    [provider]  lansoprazole (PREVACID) 15 MG capsule Take 15 mg by mouth daily before breakfast.    [provider]  loratadine (CLARITIN) 10 MG tablet Take 10 mg by mouth daily.    [provider]  losartan-hydrochlorothiazide (HYZAAR) 100-25 MG tablet Take 1 tablet by mouth daily. 05/21/20   [provider]  metFORMIN (GLUCOPHAGE) 500 MG tablet Take 500 mg by mouth 2 (two) times daily. 05/15/20   [provider]  metoprolol succinate (TOPROL-XL) 25 MG 24 hr tablet Take 25 mg by mouth daily.     [provider]  Multiple Vitamin (MULTIVITAMIN WITH MINERALS) TABS tablet Take 1 tablet by mouth daily.    [provider]  mupirocin ointment (BACTROBAN) 2 % Apply 1 application. topically 2 (two) times daily. 04/20/22  Particia Nearing, [place]  Omega-3 Fatty Acids (FISH OIL PO) Take 4,000 mg by mouth daily.     [provider]  promethazine-dextromethorphan (PROMETHAZINE-DM) 6.25-15 MG/5ML syrup Take 5 mLs by mouth 4 (four) times daily as needed. 11/08/22   Particia Nearing, PA-C  rosuvastatin (CRESTOR) 10 MG tablet Take 1 tablet (10 mg total) by mouth daily. Please keep December appointment for future refills.  Thank you. 11/11/22   Pricilla Riffle, MD  tadalafil (CIALIS) 5 MG tablet SMARTSIG:1-4 Tablet(s) By Mouth PRN 06/11/21   [provider]  triamcinolone cream (KENALOG) 0.1 % Apply 1 application topically in the morning  and at bedtime. 03/05/20   [provider]  valACYclovir (VALTREX) 1000 MG tablet Take 1 tablet (1,000 mg total) by mouth 3 (three) times daily. Patient taking differently: Take 1,000 mg by mouth as needed. 09/09/22   Bethzy Hauck-Warren, Sadie Haber, NP    Family History History reviewed. No pertinent family history.  Social History Social History   Tobacco Use   Smoking status: Former   Smokeless tobacco: Never  Substance Use Topics   Alcohol use: Yes    Comment: occ   Drug use: No     Allergies   Peanut-containing drug products   Review of Systems Review of Systems Per HPI  Physical Exam Triage Vital Signs ED Triage Vitals [11/24/22 1921]  Enc Vitals Group     BP (!) 162/82     Pulse Rate 100     Resp (!) 22     Temp 100 F (37.8 C)     Temp Source Oral     SpO2 96 %     Weight      Height      Head Circumference      Peak Flow      Pain Score 3     Pain Loc      Pain Edu?      Excl. in GC?    No data found.  Updated Vital Signs BP (!) 162/82 (BP Location: Right Arm)   Pulse 100   Temp 100 F (37.8 C) (Oral)   Resp (!) 22   SpO2 96%   Visual Acuity Right Eye Distance:   Left Eye Distance:   Bilateral Distance:    Right Eye Near:   Left Eye Near:    Bilateral Near:     Physical Exam Vitals and nursing note reviewed.  Constitutional:      General: He is not in acute distress.    Appearance: Normal appearance.  HENT:     Head: Normocephalic.     Right Ear: Tympanic membrane, ear canal and external ear normal.     Left Ear: Tympanic membrane, ear canal and external ear normal.     Nose: Congestion present.     Right Turbinates: Enlarged and swollen.     Left Turbinates: Enlarged and swollen.     Right Sinus: No maxillary sinus tenderness or frontal sinus tenderness.     Left Sinus: No maxillary sinus tenderness or frontal sinus tenderness.     Mouth/Throat:     Lips: Pink.     Mouth: Mucous membranes are moist.     Pharynx: Uvula  midline. No posterior oropharyngeal erythema.  Eyes:     Extraocular Movements: Extraocular movements intact.     Pupils: Pupils are equal, round, and reactive to light.  Cardiovascular:     Rate and Rhythm: Normal rate and regular rhythm.  Pulses: Normal pulses.     Heart sounds: Normal heart sounds.  Pulmonary:     Effort: Pulmonary effort is normal.     Breath sounds: Normal breath sounds.  Abdominal:     General: Bowel sounds are normal.     Palpations: Abdomen is soft.     Tenderness: There is no abdominal tenderness.  Musculoskeletal:     Cervical back: Normal range of motion.  Lymphadenopathy:     Cervical: No cervical adenopathy.  Skin:    General: Skin is warm and dry.  Neurological:     General: No focal deficit present.     Mental Status: He is alert and oriented to person, place, and time.  Psychiatric:        Mood and Affect: Mood normal.        Behavior: Behavior normal.      UC Treatments / Results  Labs (all labs ordered are listed, but only abnormal results are displayed) Labs Reviewed  RESP PANEL BY RT-PCR (FLU A&B, COVID) [id number]  POCT RAPID STREP A (OFFICE)    EKG   Radiology No results found.  Procedures Procedures (including critical care time)  Medications Ordered in UC Medications - No data to display  Initial Impression / Assessment and Plan / UC Course  I have reviewed the triage vital signs and the nursing notes.  Pertinent labs & imaging results that were available during my care of the patient were reviewed by me and considered in my medical decision making (see chart for details).  Symptoms are consistent with influenza or other viral illness.  COVID/flu test is pending.  Rapid strep test was negative, throat culture is also pending.  If COVID test is positive, patient is a candidate to receive molnupiravir.  Supportive care recommendations were provided to the patient to include increasing fluids, allowing for plenty of rest,  and continuing Tylenol or ibuprofen for fever.  Patient was given strict return precautions if symptoms worsen.  Patient verbalizes understanding.  All questions were answered.  Patient is stable for discharge. Final Clinical Impressions(s) / UC Diagnoses   Final diagnoses:  Flu-like symptoms  Encounter for screening for COVID-19     Discharge Instructions      Rapid strep test is negative, COVID/flu and throat culture are pending.  As discussed, if the COVID test is positive, you are a candidate to receive molnupiravir as an antiviral therapy. Increase fluids and allow for plenty of rest. Recommend Tylenol or ibuprofen as needed for pain, fever, or general discomfort. Recommend throat lozenges, Chloraseptic or honey to help with throat pain. Warm salt water gargles 3-4 times daily to help with throat pain or discomfort. Recommend using a humidifier in the bedroom at nighttime to help with cough, and sleeping elevated on pillows while symptoms persist. Follow-up if symptoms do not improve.       ED Prescriptions   None    PDMP not reviewed this encounter.   Abran Cantor, NP 11/24/22 1954

## 2022-11-24 NOTE — ED Triage Notes (Signed)
Pt reports cough, generalized body aches, chills since last night. Last dose of tylenol/ibuprofen at 1300.

## 2022-11-24 NOTE — Discharge Instructions (Addendum)
Rapid strep test is negative, COVID/flu and throat culture are pending.  As discussed, if the COVID test is positive, you are a candidate to receive molnupiravir as an antiviral therapy. Increase fluids and allow for plenty of rest. Recommend Tylenol or ibuprofen as needed for pain, fever, or general discomfort. Recommend throat lozenges, Chloraseptic or honey to help with throat pain. Warm salt water gargles 3-4 times daily to help with throat pain or discomfort. Recommend using a humidifier in the bedroom at nighttime to help with cough, and sleeping elevated on pillows while symptoms persist. Follow-up if symptoms do not improve.

## 2022-11-25 ENCOUNTER — Telehealth (HOSPITAL_COMMUNITY): Payer: Self-pay | Admitting: Emergency Medicine

## 2022-11-25 MED ORDER — OSELTAMIVIR PHOSPHATE 75 MG PO CAPS: 75.0000 mg | ORAL_CAPSULE | Freq: Two times a day (BID) | ORAL | 0 refills | Status: DC

## 2022-11-28 LAB — CULTURE, GROUP A STREP (THRC)

## 2022-12-05 NOTE — Progress Notes (Unsigned)
Cardiology Office Note   Date:  12/08/2022   ID:  Brandon Monroe, DOB May 31, 1971, MRN [id number]  PCP:  [facility]  Cardiologist:   Dietrich Pates, MD   Pt presents for follow up of HTN   History of Present Illness: Brandon Monroe is a 51 y.o. male with a history of T2 DM, HTN, hyperlipidemia, asthma, obesity   Follows with B Mann and Dr Phillips Odor  at [facility]   He was initially sent for evaluation of dyspnea  Echo in 2021 showed normal LV systolic and diastolic function      Calcium score was 24 (80th percentile)    SInce seen the pt has said dyspnea improved   He denies SOB   No CP He has not had a sleep study yet   Waiting until he is 11 (medicare)  He denies CP  Breathing is OK  No palpitations   No dizziness  Diet: Br  6:30 AM   Poptart   Milk  2%    Lunch   Sandwich and power aide sugar free and diet soda   Occaionl chip or oatmeal Cream pie\ Dinner  7:30:   "I go all out" with dnner   Pizza, spaghetti, Diet soda Snack  Cookie or 2   Chkp Snacks at night   Current Meds  Medication Sig   albuterol (VENTOLIN HFA) 108 (90 Base) MCG/ACT inhaler SMARTSIG:1-2 Puff(s) By Mouth Every 4 Hours PRN   aspirin EC 81 MG tablet Take 81 mg by mouth daily.   diphenhydrAMINE (BENADRYL) 25 MG tablet Take 50 mg by mouth at bedtime as needed (sleep.).    ibuprofen (ADVIL,MOTRIN) 200 MG tablet Take 400 mg by mouth every 6 (six) hours as needed for moderate pain.   lansoprazole (PREVACID) 15 MG capsule Take 15 mg by mouth daily before breakfast.   loratadine (CLARITIN) 10 MG tablet Take 10 mg by mouth daily.   losartan-hydrochlorothiazide (HYZAAR) 100-25 MG tablet Take 1 tablet by mouth daily.   meloxicam (MOBIC) 15 MG tablet Take 15 mg by mouth daily.   metformin (FORTAMET) 1000 MG (OSM) 24 hr tablet Take 1,000 mg by mouth 2 (two) times daily with a meal.   metoprolol succinate (TOPROL-XL) 25 MG 24 hr tablet Take 25 mg by mouth daily.    Multiple  Vitamin (MULTIVITAMIN WITH MINERALS) TABS tablet Take 1 tablet by mouth daily.   Omega-3 Fatty Acids (FISH OIL PO) Take 4,000 mg by mouth daily.    rosuvastatin (CRESTOR) 10 MG tablet Take 1 tablet (10 mg total) by mouth daily. Please keep December appointment for future refills.  Thank you.   tadalafil (CIALIS) 5 MG tablet SMARTSIG:1-4 Tablet(s) By Mouth PRN   triamcinolone (NASACORT ALLERGY 24HR) 55 MCG/ACT AERO nasal inhaler Place 2 sprays into the nose daily.     Allergies:   Peanut-containing drug products   Past Medical History:  Diagnosis Date   Acid reflux    Esophagitis    eosinic    Hypertension    Seasonal allergies     Past Surgical History:  Procedure Laterality Date   ESOPHAGOGASTRODUODENOSCOPY     PILONIDAL CYST EXCISION N/A 06/26/2020   Procedure: CYST EXCISION PILONIDAL EXTENSIVE;  Surgeon: [name], MD;  Location: [facility];  Service: General;  Laterality: N/A;   TUMOR REMOVAL     RT testicle   WISDOM TOOTH EXTRACTION       Social History:  The patient  reports that he  has quit smoking. He has never used smokeless tobacco. He reports current alcohol use. He reports that he does not use drugs.  Hx of tob   10 years x 1 ppd  Quit 20 yrs    Family Historhee patient's family history is not on file.  Fathers family had heart issues  Mult family members with CAD Moms side of family:   HTN   Mother with CVA  ROS:  Please see the history of present illness. All other systems are reviewed bNega to the above problem except as noted.    PHYSICAL EXAM: VS:  BP 130/80 (BP Location: Left Arm, Patient Position: Sitting, Cuff Size: Normal)   Pulse 81   Ht 6' (1.829 m)   Wt (!) 352 lb (159.7 kg)   BMI 47.74 kg/m   GEN: Morbdily obese 51 yo  in no acute distress  HEENT: normal  Neck: no JVD, no carotid bruits Cardiac: RRR; no murmurs,  NO  LE edema  Respiratory:  clear to auscultation bilaterally,  GI: soft, nontender, nondistended, + BS  No hepatomegaly  MS: no  deformity Moving all extremities   Skin: warm and dry, no rash Neuro:  Strength and sensation are intact Psych: euthymic mood, full affect   EKG:  EKG is ordered today.    SR 81 bpm     Lipid Panel    Component Value Date/Time   CHOL 114 09/06/2020 0951   TRIG 101 09/06/2020 0951   HDL 30 (L) 09/06/2020 0951   CHOLHDL 3.8 09/06/2020 0951   VLDL 20 09/06/2020 0951   LDLCALC 64 09/06/2020 0951      Wt Readings from Last 3 Encounters:  12/08/22 (!) 352 lb (159.7 kg)  04/20/22 (!) 340 lb (154.2 kg)  09/08/21 (!) 357 lb 9.6 oz (162.2 kg)      ASSESSMENT AND PLAN:  1  Dyspnea  Pt says his breathing is good    Follow   2  HTN  BP is controlled on current regimne    Follow   3  CAD   Minimal coronary caclfications   Risk factor modificaiton  4  HL  LDL 62  HDL 33  Trig 53    5  DM  Discussed diet   Cut out carbs     Increase veggies     Minimze processed food Consider ozempic  6  Obesihy  Consider Ozempic with diet   F/U in 1 year    Current medicines are reviewed at length with the patient today.  The patient does not have concerns regarding medicines.  Signed, Dietrich Pates, MD  12/08/2022 4:38 PM    [facility] [address] [place], [place], [place]  [address] Phone: [phone]; Fax: [phone]

## 2022-12-08 ENCOUNTER — Ambulatory Visit: Payer: 59 | Attending: Internal Medicine | Admitting: Internal Medicine

## 2022-12-08 ENCOUNTER — Encounter: Payer: Self-pay | Admitting: Internal Medicine

## 2022-12-08 VITALS — BP 130/80 | HR 81 | Ht 72.0 in | Wt 352.0 lb

## 2022-12-08 DIAGNOSIS — E782 Mixed hyperlipidemia: Secondary | ICD-10-CM | POA: Diagnosis not present

## 2022-12-08 MED ORDER — ROSUVASTATIN CALCIUM 10 MG PO TABS: 10.0000 mg | ORAL_TABLET | Freq: Every day | ORAL | 3 refills | Status: DC

## 2022-12-08 MED ORDER — LOSARTAN POTASSIUM-HCTZ 100-25 MG PO TABS: 1.0000 | ORAL_TABLET | Freq: Every day | ORAL | 3 refills | Status: DC

## 2022-12-08 MED ORDER — METOPROLOL SUCCINATE ER 25 MG PO TB24: 25.0000 mg | ORAL_TABLET | Freq: Every day | ORAL | 3 refills | Status: AC

## 2022-12-08 NOTE — Patient Instructions (Signed)
Medication Instructions:   *If you need a refill on your cardiac medications before your next appointment, please call your pharmacy*   Lab Work:  If you have labs (blood work) drawn today and your tests are completely normal, you will receive your results only by: MyChart Message (if you have MyChart) OR A paper copy in the mail If you have any lab test that is abnormal or we need to change your treatment, we will call you to review the results.   Testing/Procedures:    Follow-Up: At [facility], you and your health needs are our priority.  As part of our continuing mission to provide you with exceptional heart care, we have created designated Provider Care Teams.  These Care Teams include your primary Cardiologist (physician) and Advanced Practice Providers (APPs -  Physician Assistants and Nurse Practitioners) who all work together to provide you with the care you need, when you need it.  We recommend signing up for the patient portal called "MyChart".  Sign up information is provided on this After Visit Summary.  MyChart is used to connect with patients for Virtual Visits (Telemedicine).  Patients are able to view lab/test results, encounter notes, upcoming appointments, etc.  Non-urgent messages can be sent to your provider as well.   To learn more about what you can do with MyChart, go to [email or web address].    Your next appointment:   1 year(s)  The format for your next appointment:   In Person  Provider:   Paula Ross, MD     Other Instructions   Important Information About Sugar       

## 2023-02-06 ENCOUNTER — Ambulatory Visit
Admission: EM | Admit: 2023-02-06 | Discharge: 2023-02-06 | Disposition: A | Discharge status: Home / Self Care | Payer: 59 | Attending: Nurse Practitioner | Admitting: Nurse Practitioner

## 2023-02-06 DIAGNOSIS — U071 COVID-19: Secondary | ICD-10-CM | POA: Diagnosis not present

## 2023-02-06 MED ORDER — PROMETHAZINE-DM 6.25-15 MG/5ML PO SYRP: 5.0000 mL | ORAL_SOLUTION | Freq: Four times a day (QID) | ORAL | 0 refills | Status: DC | PRN

## 2023-02-06 MED ORDER — PAXLOVID (300/100) 20 X 150 MG & 10 X 100MG PO TBPK: 3.0000 | ORAL_TABLET | Freq: Two times a day (BID) | ORAL | 0 refills | Status: AC

## 2023-02-06 NOTE — Discharge Instructions (Addendum)
Take medication as prescribed. Increase fluids and allow for plenty of rest.  May take over-the-counter Tylenol or ibuprofen as needed for pain, fever, or general discomfort. Recommend using a humidifier in your bedroom at nighttime during sleep and sleeping elevated on pillows while cough symptoms persist. Normal saline nasal spray throughout the day to help with nasal congestion and runny nose. Water gargles 3-4 times daily while throat pain persist. You will take the Paxlovid for the next 5 days.  As discussed, you will need to remain isolated until you have completed the medication.  If you continue to experience symptoms after completing the Paxlovid, continue to wear your mask for an additional 5 days.  If you do not have any symptoms after complete the Paxlovid, you can return to your normal activities. Go to the emergency department immediately if you experience shortness of breath, difficulty breathing, become unable to speak in a complete sentence, or have other concerns. Please follow-up with your primary care physician to let them know you have recently tested positive for COVID. Follow-up as needed.

## 2023-02-06 NOTE — ED Triage Notes (Signed)
Pt reports sore throat and cough since last night. Girlfriend has Mark.   Reports positive COVID test at home today .

## 2023-02-06 NOTE — ED Provider Notes (Signed)
[facility]    CSN: [id number] Arrival date & time: 02/06/23  1125      History   Chief Complaint Chief Complaint  Patient presents with   Sore Throat    + Covid test. Tested positive at home. - Entered by patient    HPI Brandon Monroe is a 52 y.o. male.   The history is provided by the patient.   The patient presents for complaints of sore throat, nasal congestion, fatigue, and cough.  Patient states symptoms started over the past 24 hours.  He denies fever, chills, ear pain, wheezing, shortness of breath, difficulty breathing, or GI symptoms.  Patient reports that his girlfriend tested positive for COVID over the past few days.  He states that he took a home COVID test this morning which was also positive.  Past Medical History:  Diagnosis Date   Acid reflux    Esophagitis    eosinic    Hypertension    Seasonal allergies     Patient Active Problem List   Diagnosis Date Noted   Pilonidal cyst    EOSINOPHILIC ESOPHAGITIS [id number]   FINGER SUP FB W/O MAJ OPEN WOUND&W/O MENTION INF 06/29/2008    Past Surgical History:  Procedure Laterality Date   ESOPHAGOGASTRODUODENOSCOPY     PILONIDAL CYST EXCISION N/A 06/26/2020   Procedure: CYST EXCISION PILONIDAL EXTENSIVE;  Surgeon: Aviva Signs, MD;  Location: [facility];  Service: General;  Laterality: N/A;   TUMOR REMOVAL     RT testicle   WISDOM TOOTH EXTRACTION         Home Medications    Prior to Admission medications   Medication Sig Start Date End Date Taking? Authorizing Provider  nirmatrelvir & ritonavir (PAXLOVID, 300/100,) 20 x 150 MG & 10 x 100MG TBPK Take 3 tablets by mouth 2 (two) times daily for 5 days. 02/06/23 02/11/23 Yes Jaci Desanto-Warren, Alda Lea, NP  promethazine-dextromethorphan (PROMETHAZINE-DM) 6.25-15 MG/5ML syrup Take 5 mLs by mouth 4 (four) times daily as needed for cough. 02/06/23  Yes Maison Kestenbaum-Warren, Alda Lea, NP  albuterol (VENTOLIN HFA) 108 (90 Base) MCG/ACT inhaler SMARTSIG:1-2  Puff(s) By Mouth Every 4 Hours PRN 08/15/21   [provider]  aspirin EC 81 MG tablet Take 81 mg by mouth daily.    [provider]  diphenhydrAMINE (BENADRYL) 25 MG tablet Take 50 mg by mouth at bedtime as needed (sleep.).     [provider]  ibuprofen (ADVIL,MOTRIN) 200 MG tablet Take 400 mg by mouth every 6 (six) hours as needed for moderate pain.    [provider]  lansoprazole (PREVACID) 15 MG capsule Take 15 mg by mouth daily before breakfast.    [provider]  loratadine (CLARITIN) 10 MG tablet Take 10 mg by mouth daily.    [provider]  losartan-hydrochlorothiazide (HYZAAR) 100-25 MG tablet Take 1 tablet by mouth daily. 12/08/22   Fay Records, MD  meloxicam (MOBIC) 15 MG tablet Take 15 mg by mouth daily. 06/04/22   [provider]  metformin (FORTAMET) 1000 MG (OSM) 24 hr tablet Take 1,000 mg by mouth 2 (two) times daily with a meal.    [provider]  metoprolol succinate (TOPROL-XL) 25 MG 24 hr tablet Take 1 tablet (25 mg total) by mouth daily. 12/08/22   Fay Records, MD  Multiple Vitamin (MULTIVITAMIN WITH MINERALS) TABS tablet Take 1 tablet by mouth daily.    [provider]  Omega-3 Fatty Acids (FISH OIL PO) Take 4,000 mg  by mouth daily.     [provider]  rosuvastatin (CRESTOR) 10 MG tablet Take 1 tablet (10 mg total) by mouth daily. 12/08/22   Fay Records, MD  tadalafil (CIALIS) 5 MG tablet SMARTSIG:1-4 Tablet(s) By Mouth PRN 06/11/21   [provider]  triamcinolone (NASACORT ALLERGY 24HR) 55 MCG/ACT AERO nasal inhaler Place 2 sprays into the nose daily.    [provider]    Family History History reviewed. No pertinent family history.  Social History Social History   Tobacco Use   Smoking status: Former   Smokeless tobacco: Never  Substance Use Topics   Alcohol use: Yes    Comment: occ   Drug use: No     Allergies   Peanut-containing drug  products   Review of Systems Review of Systems Per HPI  Physical Exam Triage Vital Signs ED Triage Vitals  Enc Vitals Group     BP 02/06/23 1247 (!) 171/89     Pulse Rate 02/06/23 1247 84     Resp 02/06/23 1247 20     Temp 02/06/23 1247 99.1 F (37.3 C)     Temp Source 02/06/23 1247 Temporal     SpO2 02/06/23 1247 96 %     Weight --      Height --      Head Circumference --      Peak Flow --      Pain Score 02/06/23 1248 5     Pain Loc --      Pain Edu? --      Excl. in [place]? --    No data found.  Updated Vital Signs BP (!) 171/89 (BP Location: Right Arm)   Pulse 84   Temp 99.1 F (37.3 C) (Temporal)   Resp 20   SpO2 96%   Visual Acuity Right Eye Distance:   Left Eye Distance:   Bilateral Distance:    Right Eye Near:   Left Eye Near:    Bilateral Near:     Physical Exam Vitals and nursing note reviewed.  Constitutional:      General: He is not in acute distress. HENT:     Head: Normocephalic and atraumatic.     Right Ear: Tympanic membrane and ear canal normal.     Left Ear: Tympanic membrane and ear canal normal.     Nose: Congestion present. No rhinorrhea.     Mouth/Throat:     Pharynx: Uvula midline. Pharyngeal swelling and posterior oropharyngeal erythema present.     Tonsils: 1+ on the right. 1+ on the left.  Eyes:     Extraocular Movements: Extraocular movements intact.     Conjunctiva/sclera: Conjunctivae normal.     Pupils: Pupils are equal, round, and reactive to light.  Neck:     Thyroid: No thyromegaly.     Trachea: No tracheal deviation.  Cardiovascular:     Rate and Rhythm: Normal rate and regular rhythm.     Pulses: Normal pulses.     Heart sounds: Normal heart sounds.  Pulmonary:     Effort: Pulmonary effort is normal.     Breath sounds: Normal breath sounds.  Abdominal:     General: Bowel sounds are normal. There is no distension.     Palpations: Abdomen is soft.     Tenderness: There is no abdominal tenderness.   Musculoskeletal:     Cervical back: Normal range of motion and neck supple.  Skin:    General: Skin is warm and dry.  Neurological:     General: No focal deficit present.     Mental Status: He is alert and oriented to person, place, and time.  Psychiatric:        Mood and Affect: Mood normal.        Behavior: Behavior normal.        Thought Content: Thought content normal.        Judgment: Judgment normal.      UC Treatments / Results  Labs (all labs ordered are listed, but only abnormal results are displayed) Labs Reviewed - No data to display  EKG   Radiology No results found.  Procedures Procedures (including critical care time)  Medications Ordered in UC Medications - No data to display  Initial Impression / Assessment and Plan / UC Course  I have reviewed the triage vital signs and the nursing notes.  Pertinent labs & imaging results that were available during my care of the patient were reviewed by me and considered in my medical decision making (see chart for details).  The patient is well-appearing, he is in no acute distress, he is hypertensive, but vital signs are otherwise stable.  Patient with positive self-administered home COVID test.  He would like to be started on antiviral therapy.  Will start patient on Paxlovid as directed.  Patient advised to hold his Crestor for the next 2 weeks.  For his cough, patient was prescribed Promethazine DM.  Care recommendations were provided to the patient to include increasing fluids, allowing for plenty of rest, use of a humidifier in the bedroom at nighttime during sleep, and warm salt water gargles.  Discussed isolation precautions with the patient, along with providing when ER follow-up may be necessary.  Patient verbalizes understanding.  All questions were answered.  Patient is stable for discharge.  Final Clinical Impressions(s) / UC Diagnoses   Final diagnoses:  Positive self-administered antigen test for  COVID-19     Discharge Instructions      Take medication as prescribed. Increase fluids and allow for plenty of rest.  May take over-the-counter Tylenol or ibuprofen as needed for pain, fever, or general discomfort. Recommend using a humidifier in your bedroom at nighttime during sleep and sleeping elevated on pillows while cough symptoms persist. Normal saline nasal spray throughout the day to help with nasal congestion and runny nose. Water gargles 3-4 times daily while throat pain persist. You will take the Paxlovid for the next 5 days.  As discussed, you will need to remain isolated until you have completed the medication.  If you continue to experience symptoms after completing the Paxlovid, continue to wear your mask for an additional 5 days.  If you do not have any symptoms after complete the Paxlovid, you can return to your normal activities. Go to the emergency department immediately if you experience shortness of breath, difficulty breathing, become unable to speak in a complete sentence, or have other concerns. Please follow-up with your primary care physician to let them know you have recently tested positive for COVID. Follow-up as needed.     ED Prescriptions     Medication Sig Dispense Auth. Provider   nirmatrelvir & ritonavir (PAXLOVID, 300/100,) 20 x 150 MG & 10 x 100MG TBPK Take 3 tablets by mouth 2 (two) times daily for 5 days. 30 tablet Analeigh Aries-Warren, Alda Lea, NP   promethazine-dextromethorphan (PROMETHAZINE-DM) 6.25-15 MG/5ML syrup Take 5 mLs by mouth 4 (four) times daily as needed for cough. 118 mL Aili Casillas-Warren, Alda Lea, NP  PDMP not reviewed this encounter.   Tish Men, NP 02/06/23 1330

## 2023-02-22 DIAGNOSIS — K200 Eosinophilic esophagitis: Secondary | ICD-10-CM | POA: Diagnosis not present

## 2023-02-22 DIAGNOSIS — R1319 Other dysphagia: Secondary | ICD-10-CM | POA: Diagnosis not present

## 2023-06-21 DIAGNOSIS — Z0000 Encounter for general adult medical examination without abnormal findings: Secondary | ICD-10-CM | POA: Diagnosis not present

## 2023-06-21 DIAGNOSIS — G4730 Sleep apnea, unspecified: Secondary | ICD-10-CM | POA: Diagnosis not present

## 2023-06-21 DIAGNOSIS — I10 Essential (primary) hypertension: Secondary | ICD-10-CM | POA: Diagnosis not present

## 2023-06-21 DIAGNOSIS — Z6841 Body Mass Index (BMI) 40.0 and over, adult: Secondary | ICD-10-CM | POA: Diagnosis not present

## 2023-06-21 DIAGNOSIS — Z1331 Encounter for screening for depression: Secondary | ICD-10-CM | POA: Diagnosis not present

## 2023-06-21 DIAGNOSIS — E7849 Other hyperlipidemia: Secondary | ICD-10-CM | POA: Diagnosis not present

## 2023-06-21 DIAGNOSIS — L089 Local infection of the skin and subcutaneous tissue, unspecified: Secondary | ICD-10-CM | POA: Diagnosis not present

## 2023-06-21 DIAGNOSIS — E782 Mixed hyperlipidemia: Secondary | ICD-10-CM | POA: Diagnosis not present

## 2023-06-21 DIAGNOSIS — R7303 Prediabetes: Secondary | ICD-10-CM | POA: Diagnosis not present

## 2023-06-21 DIAGNOSIS — M25551 Pain in right hip: Secondary | ICD-10-CM | POA: Diagnosis not present

## 2023-10-15 ENCOUNTER — Ambulatory Visit
Admission: RE | Admit: 2023-10-15 | Discharge: 2023-10-15 | Disposition: A | Discharge status: Home / Self Care | Payer: 59 | Source: Ambulatory Visit

## 2023-10-15 ENCOUNTER — Other Ambulatory Visit: Payer: Self-pay

## 2023-10-15 VITALS — BP 159/95 | HR 73 | Temp 99.1°F | Resp 20

## 2023-10-15 DIAGNOSIS — B029 Zoster without complications: Secondary | ICD-10-CM | POA: Diagnosis not present

## 2023-10-15 MED ORDER — VALACYCLOVIR HCL 1 G PO TABS: 1000.0000 mg | ORAL_TABLET | Freq: Three times a day (TID) | ORAL | 0 refills | Status: AC

## 2023-10-15 NOTE — Discharge Instructions (Signed)
Take the valacyclovir as prescribed to treat the shingles.  Seek care if symptoms do not improve with this treatment.

## 2023-10-15 NOTE — ED Triage Notes (Addendum)
Pt reports history of recurrent outbreak of shingles and reports most recent outbreak started last night. Pt reports intermittent pain/burning sensation on left side of neck.

## 2023-10-15 NOTE — ED Provider Notes (Signed)
[facility]    CSN: [id number] Arrival date & time: 10/15/23  1154      History   Chief Complaint Chief Complaint  Patient presents with   Blister    Shingles outbreak, recurrent - Entered by patient    HPI Brandon Monroe is a 52 y.o. male.   Patient presents today with 1 day history of rash to left side of his neck that is itchy and burning.  Reports he has had multiple shingles outbreaks in the same area and thinks it is shingles.  He denies any oozing or draining from the area.  No fevers, cough, or congestion recently.  Reports he had his shingles vaccines earlier this year.  Left shingles outbreak at least 1 year ago.    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 Authorizing Provider  valACYclovir (VALTREX) 1000 MG tablet Take 1 tablet (1,000 mg total) by mouth every 8 (eight) hours for 10 days. 10/15/23 10/25/23 Yes Valentino Nose, NP  albuterol (VENTOLIN HFA) 108 (90 Base) MCG/ACT inhaler SMARTSIG:1-2 Puff(s) By Mouth Every 4 Hours PRN 08/15/21   [provider]  aspirin EC 81 MG tablet Take 81 mg by mouth daily.    [provider]  diphenhydrAMINE (BENADRYL) 25 MG tablet Take 50 mg by mouth at bedtime as needed (sleep.).     [provider]  ibuprofen  (ADVIL,MOTRIN) 200 MG tablet Take 400 mg by mouth every 6 (six) hours as needed for moderate pain.    [provider]  lansoprazole (PREVACID) 15 MG capsule Take 15 mg by mouth daily before breakfast.    [provider]  loratadine (CLARITIN) 10 MG tablet Take 10 mg by mouth daily.    [provider]  losartan-hydrochlorothiazide (HYZAAR) 100-25 MG tablet Take 1 tablet by mouth daily. 12/08/22   Pricilla Riffle, MD  meloxicam (MOBIC) 15 MG tablet Take 15 mg by mouth daily. 06/04/22   [provider]  metformin (FORTAMET) 1000 MG (OSM) 24 hr tablet Take 1,000 mg by mouth 2 (two) times daily with a meal.    [provider]  metoprolol succinate (TOPROL-XL) 25 MG 24 hr tablet Take 1 tablet (25 mg total) by mouth daily. 12/08/22   Pricilla Riffle, MD  Multiple Vitamin (MULTIVITAMIN WITH MINERALS) TABS tablet Take 1 tablet by mouth daily.    [provider]  olmesartan-hydrochlorothiazide (BENICAR HCT) 40-25 MG tablet Take 1 tablet by mouth daily.    [provider]  Omega-3 Fatty Acids (FISH OIL PO) Take 4,000 mg by mouth daily.     [provider]  promethazine-dextromethorphan (PROMETHAZINE-DM) 6.25-15 MG/5ML syrup Take 5 mLs by mouth 4 (  four) times daily as needed for cough. 02/06/23   Leath-Warren, Sadie Haber, NP  rosuvastatin (CRESTOR) 10 MG tablet Take 1 tablet (10 mg total) by mouth daily. 12/08/22   Pricilla Riffle, MD  tadalafil (CIALIS) 5 MG tablet SMARTSIG:1-4 Tablet(s) By Mouth PRN 06/11/21   [provider]  triamcinolone (NASACORT ALLERGY 24HR) 55 MCG/ACT AERO nasal inhaler Place 2 sprays into the nose daily.    [provider]    Family History History reviewed. No pertinent family history.  Social History Social History   Tobacco Use   Smoking status: Former   Smokeless tobacco: Never  Substance Use Topics   Alcohol use: Yes    Comment: occ   Drug use: No     Allergies   Peanut-containing  drug products   Review of Systems Review of Systems Per HPI  Physical Exam Triage Vital Signs ED Triage Vitals  Encounter Vitals Group     BP 10/15/23 1208 (!) 159/95     Systolic BP Percentile --      Diastolic BP Percentile --      Pulse Rate 10/15/23 1208 73     Resp 10/15/23 1208 20     Temp 10/15/23 1208 99.1 F (37.3 C)     Temp Source 10/15/23 1208 Oral     SpO2 10/15/23 1208 94 %     Weight --      Height --      Head Circumference --      Peak Flow --      Pain Score 10/15/23 1206 0     Pain Loc --      Pain Education --      Exclude from Growth Chart --    No data found.  Updated Vital Signs BP (!) 159/95 (BP Location: Right Arm)   Pulse 73   Temp 99.1 F (37.3 C) (Oral)   Resp 20   SpO2 94%   Visual Acuity Right Eye Distance:   Left Eye Distance:   Bilateral Distance:    Right Eye Near:   Left Eye Near:    Bilateral Near:     Physical Exam Vitals and nursing note reviewed.  Constitutional:      General: He is not in acute distress.    Appearance: Normal appearance. He is not toxic-appearing.  HENT:     Head: Normocephalic and atraumatic.     Mouth/Throat:     Mouth: Mucous membranes are moist.  Pulmonary:     Effort: Pulmonary effort is normal. No respiratory distress.  Skin:    General: Skin is warm and dry.     Capillary Refill: Capillary refill takes less than 2 seconds.     Coloration: Skin is not jaundiced or pale.     Findings: Rash present. No erythema. Rash is vesicular.     Comments: Vesicular rash to left neck without surrounding erythema, fluctuance, or warmth.  No active drainage.  Neurological:     Mental Status: He is alert and oriented to person, place, and time.  Psychiatric:        Behavior: Behavior is cooperative.      UC Treatments / Results  Labs (all labs ordered are listed, but only abnormal results are displayed) Labs Reviewed - No data to display  EKG   Radiology No results  found.  Procedures Procedures (including critical care time)  Medications Ordered in UC Medications - No data to display  Initial Impression / Assessment and Plan /  UC Course  I have reviewed the triage vital signs and the nursing notes.  Pertinent labs & imaging results that were available during my care of the patient were reviewed by me and considered in my medical decision making (see chart for details).   Patient is well-appearing, normotensive, afebrile, not tachycardic, not tachypneic, oxygenating well on room air.    1. Herpes zoster without complication Treat with valacyclovir 1 g 3 times daily for 7 to 10 days Supportive care discussed with patient Recommended following up if symptoms do not improve with treatment  The patient was given the opportunity to ask questions.  All questions answered to their satisfaction.  The patient is in agreement to this plan.    Final Clinical Impressions(s) / UC Diagnoses   Final diagnoses:  Herpes zoster without complication     Discharge Instructions      Take the valacyclovir as prescribed to treat the shingles.  Seek care if symptoms do not improve with this treatment.   ED Prescriptions     Medication Sig Dispense Auth. Provider   valACYclovir (VALTREX) 1000 MG tablet Take 1 tablet (1,000 mg total) by mouth every 8 (eight) hours for 10 days. 30 tablet Valentino Nose, NP      PDMP not reviewed this encounter.   Valentino Nose, NP 10/15/23 1239

## 2023-12-03 ENCOUNTER — Other Ambulatory Visit: Payer: Self-pay | Admitting: Internal Medicine

## 2024-01-02 ENCOUNTER — Ambulatory Visit
Admission: EM | Admit: 2024-01-02 | Discharge: 2024-01-02 | Disposition: A | Discharge status: Home / Self Care | Payer: 59 | Attending: Family Medicine | Admitting: Family Medicine

## 2024-01-02 DIAGNOSIS — M7661 Achilles tendinitis, right leg: Secondary | ICD-10-CM

## 2024-01-02 MED ORDER — PREDNISONE 20 MG PO TABS: 40.0000 mg | ORAL_TABLET | Freq: Every day | ORAL | 0 refills | Status: DC

## 2024-01-02 MED ORDER — TIZANIDINE HCL 4 MG PO CAPS: 4.0000 mg | ORAL_CAPSULE | Freq: Three times a day (TID) | ORAL | 0 refills | Status: AC | PRN

## 2024-01-02 NOTE — ED Provider Notes (Signed)
 [facility]    CSN: [id number] Arrival date & time: 01/02/24  1226      History   Chief Complaint No chief complaint on file.   HPI Brandon Monroe is a 53 y.o. male.   Patient presenting today with pain and swelling to the right Achilles for the past 2 weeks with no known injury.  States the area has been slightly swollen and is significantly worse with movement.  Denies numbness, tingling, weakness, loss of range of motion.  So far trying ice with minimal relief.    Past Medical History:  Diagnosis Date   Acid reflux    Esophagitis    eosinic    Hypertension    Seasonal allergies     Patient Active Problem List   Diagnosis Date Noted   Pilonidal cyst    EOSINOPHILIC ESOPHAGITIS 06/29/2008   FINGER SUP FB W/O MAJ OPEN WOUND&W/O MENTION INF 06/29/2008    Past Surgical History:  Procedure Laterality Date   ESOPHAGOGASTRODUODENOSCOPY     PILONIDAL CYST EXCISION N/A 06/26/2020   Procedure: CYST EXCISION PILONIDAL EXTENSIVE;  Surgeon: Mavis Anes, MD;  Location: [facility];  Service: General;  Laterality: N/A;   TUMOR REMOVAL     RT testicle   WISDOM TOOTH EXTRACTION         Home Medications    Prior to Admission medications   Medication Sig Start Date End Date Taking? Authorizing Provider  predniSONE  (DELTASONE ) 20 MG tablet Take 2 tablets (40 mg total) by mouth daily with breakfast. 01/02/24  Yes Stuart Vernell Norris, PA-C  tiZANidine  (ZANAFLEX ) 4 MG capsule Take 1 capsule (4 mg total) by mouth 3 (three) times daily as needed for muscle spasms. Do not drink alcohol or drive while taking this medication.  May cause drowsiness. 01/02/24  Yes Stuart Vernell Norris, PA-C  albuterol (VENTOLIN HFA) 108 (90 Base) MCG/ACT inhaler SMARTSIG:1-2 Puff(s) By Mouth Every 4 Hours PRN 08/15/21   [provider]  aspirin EC 81 MG tablet Take 81 mg by mouth daily.    [provider]  diphenhydrAMINE (BENADRYL) 25 MG tablet Take 50 mg by mouth at  bedtime as needed (sleep.).     [provider]  ibuprofen (ADVIL,MOTRIN) 200 MG tablet Take 400 mg by mouth every 6 (six) hours as needed for moderate pain.    [provider]  lansoprazole (PREVACID) 15 MG capsule Take 15 mg by mouth daily before breakfast.    [provider]  loratadine (CLARITIN) 10 MG tablet Take 10 mg by mouth daily.    [provider]  losartan -hydrochlorothiazide (HYZAAR) 100-25 MG tablet Take 1 tablet by mouth daily. 12/08/22   Okey Vina GAILS, MD  meloxicam (MOBIC) 15 MG tablet Take 15 mg by mouth daily. 06/04/22   [provider]  metformin (FORTAMET) 1000 MG (OSM) 24 hr tablet Take 1,000 mg by mouth 2 (two) times daily with a meal.    [provider]  metoprolol  succinate (TOPROL -XL) 25 MG 24 hr tablet Take 1 tablet (25 mg total) by mouth daily. 12/08/22   Okey Vina GAILS, MD  Multiple Vitamin (MULTIVITAMIN WITH MINERALS) TABS tablet Take 1 tablet by mouth daily.    [provider]  olmesartan-hydrochlorothiazide (BENICAR HCT) 40-25 MG tablet Take 1 tablet by mouth daily.    [provider]  Omega-3 Fatty Acids (FISH OIL PO) Take 4,000 mg by mouth daily.     [provider]  promethazine -dextromethorphan (PROMETHAZINE -DM) 6.25-15 MG/5ML syrup Take 5  mLs by mouth 4 (four) times daily as needed for cough. 02/06/23   Leath-Warren, Etta PARAS, NP  rosuvastatin  (CRESTOR ) 10 MG tablet Take 1 tablet (10 mg total) by mouth daily. Pt needs to schedule appt with provider for further refills - 1st attempt 12/06/23   Okey Vina GAILS, MD  tadalafil (CIALIS) 5 MG tablet SMARTSIG:1-4 Tablet(s) By Mouth PRN 06/11/21   [provider]  triamcinolone (NASACORT ALLERGY 24HR) 55 MCG/ACT AERO nasal inhaler Place 2 sprays into the nose daily.    [provider]    Family History History reviewed. No pertinent family history.  Social History Social History   Tobacco Use   Smoking status: Former    Smokeless tobacco: Never  Substance Use Topics   Alcohol use: Yes    Comment: occ   Drug use: No     Allergies   Peanut-containing drug products   Review of Systems Review of Systems Per HPI  Physical Exam Triage Vital Signs ED Triage Vitals  Encounter Vitals Group     BP 01/02/24 1401 (!) 152/83     Systolic BP Percentile --      Diastolic BP Percentile --      Pulse Rate 01/02/24 1401 81     Resp 01/02/24 1401 17     Temp 01/02/24 1401 98.4 F (36.9 C)     Temp Source 01/02/24 1401 Oral     SpO2 01/02/24 1401 96 %     Weight --      Height --      Head Circumference --      Peak Flow --      Pain Score 01/02/24 1404 3     Pain Loc --      Pain Education --      Exclude from Growth Chart --    No data found.  Updated Vital Signs BP (!) 152/83 (BP Location: Right Arm)   Pulse 81   Temp 98.4 F (36.9 C) (Oral)   Resp 17   SpO2 96%   Visual Acuity Right Eye Distance:   Left Eye Distance:   Bilateral Distance:    Right Eye Near:   Left Eye Near:    Bilateral Near:     Physical Exam Vitals and nursing note reviewed.  Constitutional:      Appearance: Normal appearance.  HENT:     Head: Atraumatic.  Eyes:     Extraocular Movements: Extraocular movements intact.     Conjunctiva/sclera: Conjunctivae normal.  Cardiovascular:     Rate and Rhythm: Normal rate and regular rhythm.  Pulmonary:     Effort: Pulmonary effort is normal.     Breath sounds: Normal breath sounds.  Musculoskeletal:        General: Swelling and tenderness present. No signs of injury. Normal range of motion.     Cervical back: Normal range of motion and neck supple.     Comments: Tenderness to palpation and mild edema to the right Achilles.  Range of motion intact  Skin:    General: Skin is warm and dry.  Neurological:     General: No focal deficit present.     Mental Status: He is oriented to person, place, and time.     Motor: No weakness.     Gait: Gait normal.      Comments: Right lower extremity neurovascular intact  Psychiatric:        Mood and Affect: Mood normal.  Thought Content: Thought content normal.        Judgment: Judgment normal.      UC Treatments / Results  Labs (all labs ordered are listed, but only abnormal results are displayed) Labs Reviewed - No data to display  EKG   Radiology No results found.  Procedures Procedures (including critical care time)  Medications Ordered in UC Medications - No data to display  Initial Impression / Assessment and Plan / UC Course  I have reviewed the triage vital signs and the nursing notes.  Pertinent labs & imaging results that were available during my care of the patient were reviewed by me and considered in my medical decision making (see chart for details).     Suspect Achilles tendinitis.  Treat with prednisone , Zanaflex , stretches, heat, massage.  Return for worsening symptoms.  Final Clinical Impressions(s) / UC Diagnoses   Final diagnoses:  Tendonitis, Achilles, right   Discharge Instructions   None    ED Prescriptions     Medication Sig Dispense Auth. Provider   predniSONE  (DELTASONE ) 20 MG tablet Take 2 tablets (40 mg total) by mouth daily with breakfast. 10 tablet Stuart Vernell Norris, PA-C   tiZANidine  (ZANAFLEX ) 4 MG capsule Take 1 capsule (4 mg total) by mouth 3 (three) times daily as needed for muscle spasms. Do not drink alcohol or drive while taking this medication.  May cause drowsiness. 15 capsule Stuart Vernell Norris, [place]      PDMP not reviewed this encounter.   Stuart Vernell Norris, [place] 01/02/24 1456

## 2024-01-02 NOTE — ED Triage Notes (Addendum)
 Pt reports right foot and heel pain x 2 weeks, pt states the back of the foot is swollen and there is a knot present. Denies injury to the right foot.

## 2024-01-09 NOTE — Progress Notes (Unsigned)
Cardiology Office Note   Date:  01/10/2024   ID:  Brandon Monroe, DOB 07/27/1971, MRN [id number]  PCP:  Assunta Found, MD  Cardiologist:   Dietrich Pates, MD   Pt presents for follow up of HTN   History of Present Illness: Brandon Monroe is a 53 y.o. male with a history of T2 DM, HTN, hyperlipidemia, asthma, obesity   Follows with B Mann and Dr Phillips Odor  at [facility]  2021  Referred for  dyspnea Echo in 2021 showed normal LV systolic and diastolic function      Set up for Calcium score CT   His calcium score was 24 (80th percentile)      Diet: Br  6:30 AM   Poptart   Milk  2%    Lunch   Sandwich and power aide sugar free and diet soda   Occaionl chip or oatmeal Cream pie\ Dinner  7:30:   "I go all out" with dnner   Pizza, spaghetti, Diet soda Snack  Cookie or 2   Chkp Snacks at night  I saw the pt in Dec 2023     Since seen he says his breathing is good   He denies CP   No dizziness  No palpitations        Current Meds  Medication Sig   albuterol (VENTOLIN HFA) 108 (90 Base) MCG/ACT inhaler SMARTSIG:1-2 Puff(s) By Mouth Every 4 Hours PRN   amLODipine (NORVASC) 5 MG tablet Take 1 tablet (5 mg total) by mouth daily.   aspirin EC 81 MG tablet Take 81 mg by mouth daily.   diphenhydrAMINE (BENADRYL) 25 MG tablet Take 50 mg by mouth at bedtime as needed (sleep.).    ibuprofen (ADVIL,MOTRIN) 200 MG tablet Take 400 mg by mouth every 6 (six) hours as needed for moderate pain.   lansoprazole (PREVACID) 15 MG capsule Take 15 mg by mouth daily before breakfast.   loratadine (CLARITIN) 10 MG tablet Take 10 mg by mouth daily.   meloxicam (MOBIC) 15 MG tablet Take 15 mg by mouth daily.   metformin (FORTAMET) 1000 MG (OSM) 24 hr tablet Take 1,000 mg by mouth 2 (two) times daily with a meal.   metoprolol succinate (TOPROL-XL) 25 MG 24 hr tablet Take 1 tablet (25 mg total) by mouth daily.   Multiple Vitamin (MULTIVITAMIN WITH MINERALS) TABS tablet Take 1 tablet by mouth daily.    olmesartan-hydrochlorothiazide (BENICAR HCT) 40-25 MG tablet Take 1 tablet by mouth daily.   Omega-3 Fatty Acids (FISH OIL PO) Take 4,000 mg by mouth daily.    rosuvastatin (CRESTOR) 10 MG tablet Take 1 tablet (10 mg total) by mouth daily. Pt needs to schedule appt with provider for further refills - 1st attempt   tadalafil (CIALIS) 5 MG tablet SMARTSIG:1-4 Tablet(s) By Mouth PRN   tiZANidine (ZANAFLEX) 4 MG capsule Take 1 capsule (4 mg total) by mouth 3 (three) times daily as needed for muscle spasms. Do not drink alcohol or drive while taking this medication.  May cause drowsiness.   triamcinolone (NASACORT ALLERGY 24HR) 55 MCG/ACT AERO nasal inhaler Place 2 sprays into the nose daily.   [DISCONTINUED] losartan-hydrochlorothiazide (HYZAAR) 100-25 MG tablet Take 1 tablet by mouth daily.   [DISCONTINUED] predniSONE (DELTASONE) 20 MG tablet Take 2 tablets (40 mg total) by mouth daily with breakfast.   [DISCONTINUED] promethazine-dextromethorphan (PROMETHAZINE-DM) 6.25-15 MG/5ML syrup Take 5 mLs by mouth 4 (four) times daily as needed for cough.     Allergies:  Peanut-containing drug products   Past Medical History:  Diagnosis Date   Acid reflux    Esophagitis    eosinic    Hypertension    Seasonal allergies     Past Surgical History:  Procedure Laterality Date   ESOPHAGOGASTRODUODENOSCOPY     PILONIDAL CYST EXCISION N/A 06/26/2020   Procedure: CYST EXCISION PILONIDAL EXTENSIVE;  Surgeon: [name], MD;  Location: [facility];  Service: General;  Laterality: N/A;   TUMOR REMOVAL     RT testicle   WISDOM TOOTH EXTRACTION       Social History:  The patient  reports that he has quit smoking. He has never used smokeless tobacco. He reports current alcohol use. He reports that he does not use drugs.  Hx of tob   10 years x 1 ppd  Quit 20 yrs    Family Historhee patient's family history is not on file.  Fathers family had heart issues  Mult family members with CAD Moms side of family:    HTN   Mother with CVA  ROS:  Please see the history of present illness. All other systems are reviewed bNega to the above problem except as noted.    PHYSICAL EXAM: VS:  BP (!) 164/94   Pulse 79   Ht 6' (1.829 m)   Wt (!) 341 lb 6.4 oz (154.9 kg)   SpO2 96%   BMI 46.30 kg/m    GEN: Morbdily obese 53 yo  in NAD HEENT: normal  Neck: JVP is not elevated Cardiac: RRR; no murmur    NO  LE edema  Respiratory:  CTA GI: Obese   Nontender   EKG:  EKG is ordered today.   NSR 77      Lipid Panel    Component Value Date/Time   CHOL 114 09/06/2020 0951   TRIG 101 09/06/2020 0951   HDL 30 (L) 09/06/2020 0951   CHOLHDL 3.8 09/06/2020 0951   VLDL 20 09/06/2020 0951   LDLCALC 64 09/06/2020 0951      Wt Readings from Last 3 Encounters:  01/10/24 (!) 341 lb 6.4 oz (154.9 kg)  12/08/22 (!) 352 lb (159.7 kg)  04/20/22 (!) 340 lb (154.2 kg)      ASSESSMENT AND PLAN:  1  Dyspnea  PT denies problems   2  HTN  BP is high   Will add amlodipine to regimen   First 2.5 then 5 mg  Follow BP     3  CAD   Minimal coronary caclfications  Ca score 24   Continue risk factor modificaiton  4  HL  LDL 55  HDL 33  Trig 82  5  DM  Reviewed diet    Cut back on carbs      Follow up in Oct 2025   Sooner if BP high   Pt to write in on MyChart   Current medicines are reviewed at length with the patient today.  The patient does not have concerns regarding medicines.  Signed, Dietrich Pates, MD  01/10/2024 10:58 PM    [facility] [address] [place], [place], [place]  [address] Phone: [phone]; Fax: [phone]

## 2024-01-10 ENCOUNTER — Encounter: Payer: Self-pay | Admitting: Internal Medicine

## 2024-01-10 ENCOUNTER — Ambulatory Visit: Payer: 59 | Attending: Internal Medicine | Admitting: Internal Medicine

## 2024-01-10 ENCOUNTER — Other Ambulatory Visit: Payer: Self-pay | Admitting: Internal Medicine

## 2024-01-10 VITALS — BP 164/94 | HR 79 | Ht 72.0 in | Wt 341.4 lb

## 2024-01-10 DIAGNOSIS — I10 Essential (primary) hypertension: Secondary | ICD-10-CM

## 2024-01-10 DIAGNOSIS — E782 Mixed hyperlipidemia: Secondary | ICD-10-CM

## 2024-01-10 MED ORDER — AMLODIPINE BESYLATE 5 MG PO TABS: 5.0000 mg | ORAL_TABLET | Freq: Every day | ORAL | 3 refills | Status: AC

## 2024-01-10 NOTE — Patient Instructions (Addendum)
Medication Instructions:  START AMLODIPINE 5 MG DAILY BUT TRY 2.5 MG FOR A FEW DAYS FIRST THEN INCREASE  *If you need a refill on your cardiac medications before your next appointment, please call your pharmacy*   Lab Work:  If you have labs (blood work) drawn today and your tests are completely normal, you will receive your results only by: MyChart Message (if you have MyChart) OR A paper copy in the mail If you have any lab test that is abnormal or we need to change your treatment, we will call you to review the results.   Testing/Procedures:    Follow-Up: At [facility], you and your health needs are our priority.  As part of our continuing mission to provide you with exceptional heart care, we have created designated Provider Care Teams.  These Care Teams include your primary Cardiologist (physician) and Advanced Practice Providers (APPs -  Physician Assistants and Nurse Practitioners) who all work together to provide you with the care you need, when you need it.  We recommend signing up for the patient portal called "MyChart".  Sign up information is provided on this After Visit Summary.  MyChart is used to connect with patients for Virtual Visits (Telemedicine).  Patients are able to view lab/test results, encounter notes, upcoming appointments, etc.  Non-urgent messages can be sent to your provider as well.   To learn more about what you can do with MyChart, go to [email or web address].    Your next appointment:  OCT 2025

## 2024-01-11 DIAGNOSIS — M7660 Achilles tendinitis, unspecified leg: Secondary | ICD-10-CM | POA: Diagnosis not present

## 2024-01-11 DIAGNOSIS — Z6841 Body Mass Index (BMI) 40.0 and over, adult: Secondary | ICD-10-CM | POA: Diagnosis not present

## 2024-01-31 ENCOUNTER — Other Ambulatory Visit (INDEPENDENT_AMBULATORY_CARE_PROVIDER_SITE_OTHER): Payer: 59

## 2024-01-31 ENCOUNTER — Ambulatory Visit: Payer: 59 | Admitting: Orthopedic Surgery

## 2024-01-31 DIAGNOSIS — M79671 Pain in right foot: Secondary | ICD-10-CM

## 2024-01-31 DIAGNOSIS — M6701 Short Achilles tendon (acquired), right ankle: Secondary | ICD-10-CM

## 2024-02-03 ENCOUNTER — Encounter: Payer: Self-pay | Admitting: Internal Medicine

## 2024-02-09 ENCOUNTER — Encounter: Payer: Self-pay | Admitting: Orthopedic Surgery

## 2024-02-09 NOTE — Progress Notes (Signed)
 Office Visit Note   Patient: Brandon Monroe           Date of Birth: [birth year]           MRN: [id number] Visit Date: 01/31/2024              Requested by: Assunta Found, MD [address] [place],  [place] [address] PCP: Assunta Found, MD  Chief Complaint  Patient presents with   Right Heel - Pain      HPI: Patient is a 53 year old gentleman who is seen for initial evaluation for right heel pain.  Patient denies any injury he states the pain started about a month ago.  Patient went to urgent care on January 12.  Patient denies any tingling burning or numbness.  He states pain is worse with start up.  Assessment & Plan: Visit Diagnoses:  1. Pain of right heel   2. Achilles tendon contracture, right     Plan: Patient has insertional Achilles tendinitis with Achilles contracture.  Patient was given instructions and Achilles stretching.  Recommended a knee-high 2 XL compression sock for venous insufficiency.  Follow-Up Instructions: Return if symptoms worsen or fail to improve.   Ortho Exam  Patient is alert, oriented, no adenopathy, well-dressed, normal affect, normal respiratory effort. Examination patient has a good dorsalis pedis pulse with the knee extended he has dorsiflexion only to neutral.  He has brawny skin color changes with pitting edema and venous insufficiency.  His calf measures 49 cm in circumference.  There is no palpable defect or nodules in the Achilles.  Imaging: No results found. No images are attached to the encounter.  Labs: Lab Results  Component Value Date   REPTSTATUS 11/28/2022 FINAL 11/24/2022   CULT  11/24/2022    NO GROUP A STREP (S.PYOGENES) ISOLATED Performed at [facility], [address] [place], [place] [address]      Lab Results  Component Value Date   ALBUMIN 3.9 09/06/2020   ALBUMIN 4.1 10/21/2016    No results found for: "MG" No results found for: "VD25OH"  No results found for: "PREALBUMIN"    Latest Ref  Rng & Units 10/21/2016   12:27 AM  CBC EXTENDED  WBC 4.0 - 10.5 K/uL 7.5   RBC 4.22 - 5.81 MIL/uL 4.53   Hemoglobin 13.0 - 17.0 g/dL 82.9   HCT 56.2 - 13.0 % 41.4   Platelets 150 - 400 K/uL 189   NEUT# 1.7 - 7.7 K/uL 3.0   Lymph# 0.7 - 4.0 K/uL 3.2      There is no height or weight on file to calculate BMI.  Orders:  Orders Placed This Encounter  Procedures   XR Os Calcis Right   No orders of the defined types were placed in this encounter.    Procedures: No procedures performed  Clinical Data: No additional findings.  ROS:  All other systems negative, except as noted in the HPI. Review of Systems  Objective: Vital Signs: There were no vitals taken for this visit.  Specialty Comments:  No specialty comments available.  PMFS History: Patient Active Problem List   Diagnosis Date Noted   Pilonidal cyst    EOSINOPHILIC ESOPHAGITIS 06/29/2008   FINGER SUP FB W/O MAJ OPEN WOUND&W/O MENTION INF 06/29/2008   Past Medical History:  Diagnosis Date   Acid reflux    Esophagitis    eosinic    Hypertension    Seasonal allergies     History reviewed. No pertinent  family history.  Past Surgical History:  Procedure Laterality Date   ESOPHAGOGASTRODUODENOSCOPY     PILONIDAL CYST EXCISION N/A 06/26/2020   Procedure: CYST EXCISION PILONIDAL EXTENSIVE;  Surgeon: [name], MD;  Location: [facility];  Service: General;  Laterality: N/A;   TUMOR REMOVAL     RT testicle   WISDOM TOOTH EXTRACTION     Social History   Occupational History   Not on file  Tobacco Use   Smoking status: Former   Smokeless tobacco: Never  Substance and Sexual Activity   Alcohol use: Yes    Comment: occ   Drug use: No   Sexual activity: Yes

## 2024-04-24 DIAGNOSIS — R1319 Other dysphagia: Secondary | ICD-10-CM | POA: Diagnosis not present

## 2024-04-24 DIAGNOSIS — K200 Eosinophilic esophagitis: Secondary | ICD-10-CM | POA: Diagnosis not present

## 2024-04-24 DIAGNOSIS — Z791 Long term (current) use of non-steroidal anti-inflammatories (NSAID): Secondary | ICD-10-CM | POA: Diagnosis not present

## 2024-05-03 DIAGNOSIS — E782 Mixed hyperlipidemia: Secondary | ICD-10-CM | POA: Diagnosis not present

## 2024-05-03 DIAGNOSIS — Z6841 Body Mass Index (BMI) 40.0 and over, adult: Secondary | ICD-10-CM | POA: Diagnosis not present

## 2024-05-03 DIAGNOSIS — G4730 Sleep apnea, unspecified: Secondary | ICD-10-CM | POA: Diagnosis not present

## 2024-05-03 DIAGNOSIS — I10 Essential (primary) hypertension: Secondary | ICD-10-CM | POA: Diagnosis not present

## 2024-05-03 DIAGNOSIS — E1165 Type 2 diabetes mellitus with hyperglycemia: Secondary | ICD-10-CM | POA: Diagnosis not present

## 2024-07-04 DIAGNOSIS — E782 Mixed hyperlipidemia: Secondary | ICD-10-CM | POA: Diagnosis not present

## 2024-07-04 DIAGNOSIS — I10 Essential (primary) hypertension: Secondary | ICD-10-CM | POA: Diagnosis not present

## 2024-07-04 DIAGNOSIS — R7303 Prediabetes: Secondary | ICD-10-CM | POA: Diagnosis not present

## 2024-07-04 DIAGNOSIS — Z125 Encounter for screening for malignant neoplasm of prostate: Secondary | ICD-10-CM | POA: Diagnosis not present

## 2024-07-10 DIAGNOSIS — E782 Mixed hyperlipidemia: Secondary | ICD-10-CM | POA: Diagnosis not present

## 2024-07-10 DIAGNOSIS — I739 Peripheral vascular disease, unspecified: Secondary | ICD-10-CM | POA: Diagnosis not present

## 2024-07-10 DIAGNOSIS — Z23 Encounter for immunization: Secondary | ICD-10-CM | POA: Diagnosis not present

## 2024-07-10 DIAGNOSIS — R7303 Prediabetes: Secondary | ICD-10-CM | POA: Diagnosis not present

## 2024-07-10 DIAGNOSIS — M2550 Pain in unspecified joint: Secondary | ICD-10-CM | POA: Diagnosis not present

## 2024-07-10 DIAGNOSIS — Z0001 Encounter for general adult medical examination with abnormal findings: Secondary | ICD-10-CM | POA: Diagnosis not present

## 2024-07-10 DIAGNOSIS — I10 Essential (primary) hypertension: Secondary | ICD-10-CM | POA: Diagnosis not present

## 2024-07-10 DIAGNOSIS — Z6841 Body Mass Index (BMI) 40.0 and over, adult: Secondary | ICD-10-CM | POA: Diagnosis not present

## 2024-07-10 DIAGNOSIS — L918 Other hypertrophic disorders of the skin: Secondary | ICD-10-CM | POA: Diagnosis not present

## 2024-07-18 DIAGNOSIS — L739 Follicular disorder, unspecified: Secondary | ICD-10-CM | POA: Diagnosis not present

## 2024-09-11 DIAGNOSIS — M2550 Pain in unspecified joint: Secondary | ICD-10-CM | POA: Diagnosis not present

## 2024-09-11 DIAGNOSIS — Z23 Encounter for immunization: Secondary | ICD-10-CM | POA: Diagnosis not present

## 2024-09-11 DIAGNOSIS — M1990 Unspecified osteoarthritis, unspecified site: Secondary | ICD-10-CM | POA: Diagnosis not present

## 2024-09-11 DIAGNOSIS — M25519 Pain in unspecified shoulder: Secondary | ICD-10-CM | POA: Diagnosis not present

## 2024-09-11 DIAGNOSIS — M25561 Pain in right knee: Secondary | ICD-10-CM | POA: Diagnosis not present

## 2024-10-23 ENCOUNTER — Encounter: Payer: Self-pay | Admitting: Radiology

## 2024-12-24 ENCOUNTER — Other Ambulatory Visit: Payer: Self-pay | Admitting: Internal Medicine
